# Patient Record
Sex: Female | Born: 1940 | Race: White | Hispanic: No | Marital: Married | Smoking: Never smoker
Health system: Southern US, Community
[De-identification: ages and names within clinical notes are randomized; demographics above are authoritative.]

## PROBLEM LIST (undated history)

## (undated) DIAGNOSIS — K3532 Acute appendicitis with perforation and localized peritonitis, without abscess: Secondary | ICD-10-CM

## (undated) DIAGNOSIS — Q430 Meckel's diverticulum (displaced) (hypertrophic): Secondary | ICD-10-CM

## (undated) DIAGNOSIS — I10 Essential (primary) hypertension: Secondary | ICD-10-CM

## (undated) DIAGNOSIS — H269 Unspecified cataract: Secondary | ICD-10-CM

## (undated) DIAGNOSIS — K56609 Unspecified intestinal obstruction, unspecified as to partial versus complete obstruction: Secondary | ICD-10-CM

## (undated) HISTORY — PX: OOPHORECTOMY: SHX86

## (undated) HISTORY — PX: CHOLECYSTECTOMY: SHX55

## (undated) HISTORY — PX: ABDOMINAL HYSTERECTOMY: SHX81

## (undated) HISTORY — PX: BREAST CYST ASPIRATION: SHX578

---

## 2010-05-06 ENCOUNTER — Ambulatory Visit: Payer: Self-pay | Admitting: Internal Medicine

## 2011-05-11 ENCOUNTER — Ambulatory Visit: Payer: Self-pay | Admitting: Internal Medicine

## 2012-05-11 ENCOUNTER — Ambulatory Visit: Payer: Self-pay | Admitting: Internal Medicine

## 2012-06-08 ENCOUNTER — Ambulatory Visit: Payer: Self-pay | Admitting: General Practice

## 2012-10-10 ENCOUNTER — Ambulatory Visit: Payer: Self-pay | Admitting: Internal Medicine

## 2012-10-27 ENCOUNTER — Ambulatory Visit: Payer: Self-pay | Admitting: Surgery

## 2012-10-27 LAB — POTASSIUM: Potassium: 3.8 mmol/L (ref 3.5–5.1)

## 2012-11-04 ENCOUNTER — Inpatient Hospital Stay: Payer: Self-pay | Admitting: General Surgery

## 2012-11-06 LAB — PLATELET COUNT: Platelet: 226 10*3/uL (ref 150–440)

## 2012-11-07 LAB — PATHOLOGY REPORT

## 2013-05-12 ENCOUNTER — Ambulatory Visit: Payer: Self-pay | Admitting: Internal Medicine

## 2013-12-03 ENCOUNTER — Emergency Department: Payer: Self-pay | Admitting: Emergency Medicine

## 2013-12-04 ENCOUNTER — Ambulatory Visit: Payer: Self-pay | Admitting: Orthopedic Surgery

## 2013-12-04 LAB — POTASSIUM: Potassium: 3.3 mmol/L — ABNORMAL LOW (ref 3.5–5.1)

## 2013-12-05 ENCOUNTER — Ambulatory Visit: Payer: Self-pay | Admitting: Orthopedic Surgery

## 2014-03-08 ENCOUNTER — Ambulatory Visit: Payer: Self-pay | Admitting: Unknown Physician Specialty

## 2014-03-09 LAB — PATHOLOGY REPORT

## 2014-05-15 ENCOUNTER — Ambulatory Visit: Payer: Self-pay | Admitting: Internal Medicine

## 2015-03-05 NOTE — Op Note (Signed)
PATIENT NAME:  Jill Salas, Jill Salas MR#:  [id number] DATE OF BIRTH:  16-Apr-1941  DATE OF PROCEDURE:  06/08/2012  PREOPERATIVE DIAGNOSIS: Displaced left distal radius fracture with ulnar styloid fracture.   POSTOPERATIVE DIAGNOSIS: Displaced left distal radius fracture with ulnar styloid fracture.   PROCEDURE PERFORMED: Open reduction and internal fixation of left distal radius fracture.   SURGEON: Laurice Record. Hooten, MD   ANESTHESIA: General.   ESTIMATED BLOOD LOSS: Minimal.   TOURNIQUET TIME: 69 minutes.   DRAINS: None.   IMPLANTS UTILIZED: [employer] DVRAS-L volar plate, seven 2.5 mm fully threaded pegs, and three 3.5 mm cortical screws.   INDICATIONS FOR SURGERY: The patient is a 74 year old right-hand dominant female who slipped and fell at home landing on her outstretched left hand. X-rays demonstrated displaced distal radius fracture. An incidental finding was minimally displaced fracture of the ulnar styloid. After discussion of the risks and benefits of surgical intervention, the patient expressed her understanding of the risks and benefits and agreed with plans for surgical intervention.   PROCEDURE IN DETAIL: The patient was brought to the Operating Room and, after adequate general anesthesia was achieved, a tourniquet was placed on the patient's upper left arm. The patient's left hand and arm were cleaned and prepped with alcohol and DuraPrep and draped in the usual sterile fashion. A "time out" was performed as per usual protocol. The left upper extremity was exsanguinated using an Esmarch, and the tourniquet was inflated to 250 mmHg. Loupe magnification was used throughout the procedure. A longitudinal incision was made along the volar surface of the forearm and wrist in line with the flexor carpi radialis tendon. Dissection was carried down through the tendon sheath and the tendon was retracted in an ulnar fashion. Dissection was carried down carefully to the pronator quadratus. A  figure-seven incision was made in the pronator quadratus and was then elevated off of the volar surface of the distal radius. The fracture site was identified and soft tissues removed from the fracture site. The fracture was then provisionally reduced and position confirmed in multiple views using the FluoroScan. A DVRAS-L volar plate was positioned on the volar surface of the wrist and provisionally maintained in position using two K wires. The reduction was felt to be excellent in both AP and lateral planes using FluoroScan. A 3.5 mm cortical screw was inserted in the slotted space of the proximal portion of the plate. Next, four 2.5 mm fully threaded pegs were inserted into the proximal row of the plate. These were locking pegs. Excellent position was noted and good maintenance of the reduction was appreciated. Next, three 2.5 mm fully threaded pegs were inserted into the distal row. Again, excellent position was noted. Finally, the proximal portion of the plate was secured using an additional two 3.5 mm cortical screws. Excellent reduction was appreciated with good position of hardware appreciated. The wound was irrigated with copious amounts of normal saline with antibiotic solution and then suctioned dry. The pronator quadratus was brought over the plate and tacked down using a #2-0 Vicryl. The tourniquet was deflated after total tourniquet time of 69 minutes. Good hemostasis was appreciated. The wound was then closed in layers using first #2-0 Vicryl followed by a running subcuticular suture of #4-0 Vicryl. Steri-Strips were applied. 10 mL of 0.25% Marcaine was injected along the incision site. A sterile dressing was applied followed by application of a volar splint.   The patient tolerated the procedure well. She was transported to the recovery room in stable  condition.  ____________________________ Laurice Record. Holley Bouche., MD [email or web address] D: 06/08/2012 15:50:48 ET T: 06/08/2012 16:25:31  ET JOB#: [id number]  cc: Laurice Record. Holley Bouche., MD, <Dictator> Laurice Record Holley Bouche MD ELECTRONICALLY SIGNED 06/08/2012 21:47

## 2015-03-08 NOTE — Op Note (Signed)
PATIENT NAME:  Jill Salas, Jill Salas MR#:  [id number] DATE OF BIRTH:  1941/09/12  DATE OF PROCEDURE:  11/04/2012  PREOPERATIVE DIAGNOSIS: Chronic acalculous cholecystitis.  POSTOPERATIVE DIAGNOSIS: Chronic acalculous cholecystitis, perforation of small bowel.   PROCEDURE: Laparoscopic cholecystectomy, cholangiogram and repair of small bowel.  SURGEON: Rochel Brome, MD     ANESTHESIA: General.   INDICATIONS: This patient has been evaluated for nausea and has had abnormally low gallbladder ejection fraction of 24%, has had gastroenterology  evaluation. She has some history of reflux, history of appendectomy, Meckel's diverticulectomy, and hysterectomy and history of bowel obstruction.  Laparoscopic cholecystectomy is recommended for definitive treatment.   DESCRIPTION OF PROCEDURE: The patient  was placed on the operating table in the supine position under general endotracheal anesthesia. The abdomen was prepared with ChloraPrep and draped in a sterile manner.   A short incision was made in the inferior aspect of the umbilicus and carried down to the deep fascia, which was grasped with a laryngeal hook. Next, with her history of multiple operations and bowel obstruction, elected to use the Xcel 11 mm trocar, and the laparoscope was inserted into the trocar and focused on the tissues, and I dissected with the trocar down through the abdominal wall and into the peritoneal cavity. Next, the peritoneal cavity was insufflated with carbon dioxide.  I could see there were a number of adhesions in the immediate area, and the scope was turned to the patient's left and then back to the right, and the liver came into view. The patient was placed in the reverse Trendelenburg position.  The next incision was made in the epigastrium slightly to the right of midline to introduce a 10 mm port. Two incisions were made in the lateral aspect of the right upper quadrant to introduce two 5 mm ports. The liver appeared to have  some old scarring on its surface but no visible mass. The gallbladder was retracted towards the right shoulder. The neck of the gallbladder was retracted inferiorly and laterally.  The porta hepatis was demonstrated. Next, dissection was carried out to isolate the cystic duct from surrounding tissues and also isolated the cystic artery from surrounding tissues. The neck of the gallbladder was mobilized with incision of the visceral peritoneum. A critical view of safety was demonstrated. Next, and Endo Clip was placed across the cystic duct adjacent to the neck of the gallbladder,  also two clips were placed on the cystic artery. Next the cystic artery was divided. This allowed better traction on the cystic duct. An opening was made in the cystic duct to introduce a Reddick Catheter. Half-strength Conray-60 dye was injected as the cholangiogram was done with fluoroscopy. The biliary tree appeared to be slightly dilated. There was a lucency seen in the distal bile duct which was very small and persisted with additional films. It appeared to be somewhat vague, possibly air bubble, although there was prompt flow of dye into the duodenum. The cholangiogram otherwise appeared normal.  The Reddick catheter  was removed. The cystic duct was doubly ligated with Endo Clips and divided. The gallbladder was dissected free from the liver with hook and cautery. There was no bleeding during this course of the dissection, and the gallbladder was delivered up through the infraumbilical port and palpated. I did not palpate any stones, and it was submitted in formalin for routine pathology. It is noted that the laparoscope was used to examine the periumbilical area and identified a number of loops of small bowel in  this area, and there was a suspicion of perforation of the small bowel although I do not see any contamination. I did lengthen the infraumbilical incision in the inferior direction and lengthened the fascial incision, and  it appeared that there was some small bowel mucosa visible. I subsequently enlarged the incision to ultimately approximately 2 inches, and this loop of small bowel with dissected free from surrounding structures. There were multiple adhesions between the small bowel and the anterior abdominal, and I did find a perforation of the antimesenteric border of the small bowel; and as the bowel was freed up, I determined that there was a second perforation near the mesenteric border. The mesenteric border perforation was brought up through the first defect and was small and was repaired with interrupted 5-0 Dexon sutures. Next, the defect on the antimesenteric border was closed transversely with a TA-30 stapler. The lumen remained patent according to palpation. One imbricating 5-0 Dexon suture was placed at the central port of the staple line. Hemostasis was intact. There was no gross contamination. Gloves were changed, and subsequently the laparoscopic instruments were removed. The fascia was closed with interrupted 0 Maxon figure-of-eight sutures. All skin incisions were closed with 5-0 chromic subcuticular suture, benzoin and Steri-Strips. Dressings were applied with paper tape.  The patient tolerated surgery satisfactorily and was prepared for transfer to the recovery room.   ____________________________ Lenna Sciara. Rochel Brome, MD [email or web address] D: 11/04/2012 13:13:00 ET T: 11/05/2012 16:09:27 ET JOB#: [id number]  cc: Loreli Dollar, MD, <Dictator> Loreli Dollar MD ELECTRONICALLY SIGNED 12/10/2012 12:39

## 2015-03-08 NOTE — Op Note (Signed)
PATIENT NAME:  Jill Salas, Jill Salas MR#:  [id number] DATE OF BIRTH:  1940-12-17  DATE OF PROCEDURE:  11/04/2012  PREOPERATIVE DIAGNOSIS: Chronic acalculous cholecystitis.  POSTOPERATIVE DIAGNOSIS: Chronic acalculous cholecystitis, perforation of small bowel.   PROCEDURE: Laparoscopic cholecystectomy, cholangiogram and repair of small bowel.  SURGEON: Rochel Brome, MD     ANESTHESIA: General.   INDICATIONS: This patient has been evaluated for nausea and has had abnormally low gallbladder ejection fraction of 24%, has had gastroenterology  evaluation. She has some history of reflux, history of appendectomy, Meckels diverticulectomy, and hysterectomy and history of bowel obstruction.  Laparoscopic cholecystectomy is recommended for definitive treatment.   DESCRIPTION OF PROCEDURE: The patient  was placed on the operating table in the supine position under general endotracheal anesthesia. The abdomen was prepared with ChloraPrep and draped in a sterile manner.   A short incision was made in the inferior aspect of the umbilicus and carried down to the deep fascia, which was grasped with a laryngeal hook. Next, with her history of multiple operations and bowel obstruction, elected to use the Xcel 11 mm trocar, and the laparoscope was inserted into the trocar and focused on the tissues, and I dissected with the trocar down through the abdominal wall and into the peritoneal cavity. Next, the peritoneal cavity was insufflated with carbon dioxide.  I could see there were a number of adhesions in the immediate area, and the scope was turned to the patient's left and then back to the right, and the liver came into view. The patient was placed in the reverse Trendelenburg position.  The next incision was made in the epigastrium slightly to the right of midline to introduce a 10 mm port. Two incisions were made in the lateral aspect of the right upper quadrant to introduce two 5 mm ports. The liver appeared to have  some old scarring on its surface but no visible mass. The gallbladder was retracted towards the right shoulder. The neck of the gallbladder was retracted inferiorly and laterally.  The porta hepatis was demonstrated. Next, dissection was carried out to isolate the cystic duct from surrounding tissues and also isolated the cystic artery from surrounding tissues. The neck of the gallbladder was mobilized with incision of the visceral peritoneum. A critical view of safety was demonstrated. Next, and Endo Clip was placed across the cystic duct adjacent to the neck of the gallbladder,  also two clips were placed on the cystic artery. Next the cystic artery was divided. This allowed better traction on the cystic duct. An opening was made in the cystic duct to introduce a Reddick Catheter. Half-strength Conray-60 dye was injected as the cholangiogram was done with fluoroscopy. The biliary tree appeared to be slightly dilated. There was a lucency seen in the distal bile duct which was very small and persisted with additional films. It appeared to be somewhat vague, possibly air bubble, although there was prompt flow of dye into the duodenum. The cholangiogram otherwise appeared normal.  The Reddick catheter  was removed. The cystic duct was doubly ligated with Endo Clips and divided. The gallbladder was dissected free from the liver with hook and cautery. There was no bleeding during this course of the dissection, and the gallbladder was delivered up through the infraumbilical port and palpated. I did not palpate any stones, and it was submitted in formalin for routine pathology. It is noted that the laparoscope was used to examine the periumbilical area and identified a number of loops of small bowel in  this area, and there was a suspicion of perforation of the small bowel although I do not see any contamination. I did lengthen the infraumbilical incision in the inferior direction and lengthened the fascial incision, and  it appeared that there was some small bowel mucosa visible. I subsequently enlarged the incision to ultimately approximately 2 inches, and this loop of small bowel with dissected free from surrounding structures. There were multiple adhesions between the small bowel and the anterior abdominal, and I did find a perforation of the antimesenteric border of the small bowel; and as the bowel was freed up, I determined that there was a second perforation near the mesenteric border. The mesenteric border perforation was brought up through the first defect and was small and was repaired with interrupted 5-0 Dexon sutures. Next, the defect on the antimesenteric border was closed transversely with a TA-30 stapler. The lumen remained patent according to palpation. One imbricating 5-0 Dexon suture was placed at the central port of the staple line. Hemostasis was intact. There was no gross contamination. Gloves were changed, and subsequently the laparoscopic instruments were removed. The fascia was closed with interrupted 0 Maxon figure-of-eight sutures. All skin incisions were closed with 5-0 chromic subcuticular suture, benzoin and Steri-Strips. Dressings were applied with paper tape.  The patient tolerated surgery satisfactorily and was prepared for transfer to the recovery room.   ____________________________ Kassie Mends, MD [email or web address] D: 11/04/2012 13:13:45 ET T: 11/05/2012 16:09:27 ET JOB#: [id number]  cc: Kassie Mends, MD, <Dictator>

## 2015-03-09 NOTE — Op Note (Signed)
PATIENT NAME:  Jill Salas, Jill Salas MR#:  [id number] DATE OF BIRTH:  [birth year]  DATE OF PROCEDURE:  12/05/2013  PREOPERATIVE DIAGNOSIS: Comminuted intra-articular right distal radius fracture.   POSTOPERATIVE DIAGNOSIS:  Comminuted intra-articular right distal radius fracture.   PROCEDURE: Open reduction and internal fixation right distal radius.   ANESTHESIA: General.   SURGEON: Hessie Knows, M.D.   DESCRIPTION OF PROCEDURE: The patient was brought to the operating room and after  general anesthesia was obtained, the right arm was prepped and draped in the usual sterile fashion with a tourniquet applied to the upper arm.  After patient identification and timeout procedures were completed, the arm tourniquet was raised to 250 mmHg. Fingertraps were applied to the index and middle fingers with 10 pounds of traction to aid in reduction. With traction and flexion, near anatomic alignment could be obtained before opening the skin.   With a volar approach made centered over the FCR tendon, the FCR tendon was identified, tendon sheath incised and the tendon retracted radially. The deep fascia was then incised and the pronator was identified and lifted off distally off its distal and radial sides, exposing the fracture site. With the traction in place, a short narrow DVR plate was applied and this was pinned in place and a single screw inserted into the slotted hole for final adjustment of plate position. When plate position was acceptable, 2 additional cortical screw holes were filled using standard technique, drilling, measuring and placing 10 mm screws. Going distally with the wrist in flexion, a threaded peg was initially placed within a subsequent multiaccess screw to try to get the large posterior fragment. After these initial screws were placed, the fracture did appear to be in position and the remaining holes were filled using smooth pegs, drilling, measuring and placing the smooth pegs. Traction was  released and on range of motion the fracture appeared stable.   The wound was irrigated and closed with 3-0 Vicryl subcutaneously and 4-0 nylon skin. Tourniquet time was 23 minutes at 250 mmHg, tourniquet being let down prior to completion of the case and hemostasis checked with electrocautery. A volar splint was applied with Xeroform, 4 x 4's, a volar splint and Ace wrap. The patient was sent to the recovery room in stable condition. There were no complications.   SPECIMEN: No specimen.   IMPLANTS: [employer] DVR volar plate and screws.    ____________________________ Laurene Footman, MD [email or web address] D: 12/05/2013 19:52:13 ET T: 12/05/2013 20:07:00 ET JOB#: [id number]  cc: Laurene Footman, MD, <Dictator> Laurene Footman MD ELECTRONICALLY SIGNED 12/06/2013 7:35

## 2015-05-15 ENCOUNTER — Other Ambulatory Visit: Payer: Self-pay | Admitting: Internal Medicine

## 2015-05-15 DIAGNOSIS — Z1231 Encounter for screening mammogram for malignant neoplasm of breast: Secondary | ICD-10-CM

## 2015-05-17 ENCOUNTER — Ambulatory Visit
Admission: RE | Admit: 2015-05-17 | Discharge: 2015-05-17 | Disposition: A | Discharge status: Home / Self Care | Payer: Medicare Other | Source: Ambulatory Visit | Attending: Internal Medicine | Admitting: Internal Medicine

## 2015-05-17 DIAGNOSIS — Z1231 Encounter for screening mammogram for malignant neoplasm of breast: Secondary | ICD-10-CM | POA: Insufficient documentation

## 2016-03-10 ENCOUNTER — Other Ambulatory Visit: Payer: Self-pay | Admitting: Internal Medicine

## 2016-03-10 DIAGNOSIS — Z1231 Encounter for screening mammogram for malignant neoplasm of breast: Secondary | ICD-10-CM

## 2016-05-20 ENCOUNTER — Other Ambulatory Visit: Payer: Self-pay | Admitting: Internal Medicine

## 2016-05-20 ENCOUNTER — Ambulatory Visit
Admission: RE | Admit: 2016-05-20 | Discharge: 2016-05-20 | Disposition: A | Discharge status: Home / Self Care | Payer: Medicare Other | Source: Ambulatory Visit | Attending: Internal Medicine | Admitting: Internal Medicine

## 2016-05-20 DIAGNOSIS — Z1231 Encounter for screening mammogram for malignant neoplasm of breast: Secondary | ICD-10-CM

## 2016-05-26 ENCOUNTER — Encounter: Payer: Self-pay | Admitting: Emergency Medicine

## 2016-05-26 ENCOUNTER — Emergency Department: Payer: Medicare Other

## 2016-05-26 ENCOUNTER — Emergency Department
Admission: EM | Admit: 2016-05-26 | Discharge: 2016-05-26 | Disposition: A | Discharge status: Home / Self Care | Payer: Medicare Other | Attending: Emergency Medicine | Admitting: Emergency Medicine

## 2016-05-26 DIAGNOSIS — I10 Essential (primary) hypertension: Secondary | ICD-10-CM | POA: Diagnosis not present

## 2016-05-26 DIAGNOSIS — Y9389 Activity, other specified: Secondary | ICD-10-CM | POA: Diagnosis not present

## 2016-05-26 DIAGNOSIS — Z7982 Long term (current) use of aspirin: Secondary | ICD-10-CM | POA: Insufficient documentation

## 2016-05-26 DIAGNOSIS — S2611XA Contusion of heart without hemopericardium, initial encounter: Secondary | ICD-10-CM

## 2016-05-26 DIAGNOSIS — Y92410 Unspecified street and highway as the place of occurrence of the external cause: Secondary | ICD-10-CM | POA: Diagnosis not present

## 2016-05-26 DIAGNOSIS — Y999 Unspecified external cause status: Secondary | ICD-10-CM | POA: Insufficient documentation

## 2016-05-26 DIAGNOSIS — R0781 Pleurodynia: Secondary | ICD-10-CM | POA: Diagnosis present

## 2016-05-26 DIAGNOSIS — Z79899 Other long term (current) drug therapy: Secondary | ICD-10-CM | POA: Diagnosis not present

## 2016-05-26 DIAGNOSIS — S2220XA Unspecified fracture of sternum, initial encounter for closed fracture: Secondary | ICD-10-CM | POA: Diagnosis not present

## 2016-05-26 HISTORY — DX: Essential (primary) hypertension: I10

## 2016-05-26 LAB — CBC
HEMATOCRIT: 40.4 % (ref 35.0–47.0)
HEMOGLOBIN: 13.7 g/dL (ref 12.0–16.0)
MCH: 30.3 pg (ref 26.0–34.0)
MCHC: 34 g/dL (ref 32.0–36.0)
MCV: 88.9 fL (ref 80.0–100.0)
Platelets: 174 10*3/uL (ref 150–440)
RBC: 4.54 MIL/uL (ref 3.80–5.20)
RDW: 13.7 % (ref 11.5–14.5)
WBC: 14.7 10*3/uL — ABNORMAL HIGH (ref 3.6–11.0)

## 2016-05-26 LAB — COMPREHENSIVE METABOLIC PANEL
ALBUMIN: 4.5 g/dL (ref 3.5–5.0)
ALK PHOS: 74 U/L (ref 38–126)
ALT: 25 U/L (ref 14–54)
ANION GAP: 9 (ref 5–15)
AST: 28 U/L (ref 15–41)
BILIRUBIN TOTAL: 0.6 mg/dL (ref 0.3–1.2)
BUN: 24 mg/dL — ABNORMAL HIGH (ref 6–20)
CALCIUM: 9.5 mg/dL (ref 8.9–10.3)
CO2: 22 mmol/L (ref 22–32)
Chloride: 106 mmol/L (ref 101–111)
Creatinine, Ser: 0.64 mg/dL (ref 0.44–1.00)
GLUCOSE: 115 mg/dL — AB (ref 65–99)
Potassium: 3.5 mmol/L (ref 3.5–5.1)
Sodium: 137 mmol/L (ref 135–145)
TOTAL PROTEIN: 7.2 g/dL (ref 6.5–8.1)

## 2016-05-26 LAB — TROPONIN I
Troponin I: <0.03 ng/mL (ref <0.03)
Troponin I: 0.03 ng/mL (ref <0.03)

## 2016-05-26 MED ORDER — IOPAMIDOL (ISOVUE-300) INJECTION 61%
75.0000 mL | Freq: Once | INTRAVENOUS | Status: AC | PRN
  Administered 2016-05-26: 75 mL via INTRAVENOUS

## 2016-05-26 MED ORDER — TRAMADOL HCL 50 MG PO TABS
50.0000 mg | ORAL_TABLET | ORAL | Status: AC
  Administered 2016-05-26: 50 mg via ORAL
  Filled 2016-05-26: qty 1

## 2016-05-26 MED ORDER — TRAMADOL HCL 50 MG PO TABS: 50.0000 mg | ORAL_TABLET | Freq: Four times a day (QID) | ORAL | Status: AC | PRN

## 2016-05-26 MED ORDER — ACETAMINOPHEN 325 MG PO TABS
650.0000 mg | ORAL_TABLET | ORAL | Status: AC
  Administered 2016-05-26: 650 mg via ORAL
  Filled 2016-05-26: qty 2
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
## 2016-09-07 ENCOUNTER — Emergency Department
Admission: EM | Admit: 2016-09-07 | Discharge: 2016-09-07 | Disposition: A | Discharge status: Home / Self Care | Payer: Medicare Other | Attending: Emergency Medicine | Admitting: Emergency Medicine

## 2016-09-07 ENCOUNTER — Emergency Department: Payer: Medicare Other

## 2016-09-07 ENCOUNTER — Encounter: Payer: Self-pay | Admitting: Emergency Medicine

## 2016-09-07 DIAGNOSIS — I10 Essential (primary) hypertension: Secondary | ICD-10-CM | POA: Diagnosis not present

## 2016-09-07 DIAGNOSIS — Z79899 Other long term (current) drug therapy: Secondary | ICD-10-CM | POA: Insufficient documentation

## 2016-09-07 DIAGNOSIS — R110 Nausea: Secondary | ICD-10-CM | POA: Insufficient documentation

## 2016-09-07 DIAGNOSIS — R42 Dizziness and giddiness: Secondary | ICD-10-CM

## 2016-09-07 HISTORY — DX: Meckel's diverticulum (displaced) (hypertrophic): Q43.0

## 2016-09-07 HISTORY — DX: Unspecified cataract: H26.9

## 2016-09-07 HISTORY — DX: Acute appendicitis with perforation, localized peritonitis, and gangrene, without abscess: K35.32

## 2016-09-07 HISTORY — DX: Unspecified intestinal obstruction, unspecified as to partial versus complete obstruction: K56.609

## 2016-09-07 LAB — COMPREHENSIVE METABOLIC PANEL
ALT: 21 U/L (ref 14–54)
AST: 21 U/L (ref 15–41)
Albumin: 3.8 g/dL (ref 3.5–5.0)
Alkaline Phosphatase: 87 U/L (ref 38–126)
Anion gap: 6 (ref 5–15)
BUN: 22 mg/dL — ABNORMAL HIGH (ref 6–20)
CHLORIDE: 107 mmol/L (ref 101–111)
CO2: 26 mmol/L (ref 22–32)
Calcium: 8.9 mg/dL (ref 8.9–10.3)
Creatinine, Ser: 0.61 mg/dL (ref 0.44–1.00)
Glucose, Bld: 120 mg/dL — ABNORMAL HIGH (ref 65–99)
POTASSIUM: 3.8 mmol/L (ref 3.5–5.1)
SODIUM: 139 mmol/L (ref 135–145)
Total Bilirubin: 0.7 mg/dL (ref 0.3–1.2)
Total Protein: 6.6 g/dL (ref 6.5–8.1)

## 2016-09-07 LAB — CBC WITH DIFFERENTIAL/PLATELET
BASOS ABS: 0 10*3/uL (ref 0–0.1)
Basophils Relative: 0 %
EOS ABS: 0 10*3/uL (ref 0–0.7)
EOS PCT: 0 %
HCT: 39.1 % (ref 35.0–47.0)
Hemoglobin: 13.3 g/dL (ref 12.0–16.0)
LYMPHS PCT: 8 %
Lymphs Abs: 0.8 10*3/uL — ABNORMAL LOW (ref 1.0–3.6)
MCH: 30.2 pg (ref 26.0–34.0)
MCHC: 33.9 g/dL (ref 32.0–36.0)
MCV: 89.1 fL (ref 80.0–100.0)
MONO ABS: 0.3 10*3/uL (ref 0.2–0.9)
Monocytes Relative: 3 %
Neutro Abs: 9.8 10*3/uL — ABNORMAL HIGH (ref 1.4–6.5)
Neutrophils Relative %: 89 %
PLATELETS: 211 10*3/uL (ref 150–440)
RBC: 4.39 MIL/uL (ref 3.80–5.20)
RDW: 13.2 % (ref 11.5–14.5)
WBC: 11.1 10*3/uL — AB (ref 3.6–11.0)

## 2016-09-07 LAB — LIPASE, BLOOD: LIPASE: 26 U/L (ref 11–51)

## 2016-09-07 LAB — TROPONIN I

## 2016-09-07 MED ORDER — MECLIZINE HCL 25 MG PO TABS
12.5000 mg | ORAL_TABLET | Freq: Once | ORAL | Status: AC
  Administered 2016-09-07: 12.5 mg via ORAL
  Filled 2016-09-07: qty 1

## 2016-09-07 MED ORDER — SODIUM CHLORIDE 0.9 % IV BOLUS (SEPSIS)
1000.0000 mL | Freq: Once | INTRAVENOUS | Status: AC
  Administered 2016-09-07: 1000 mL via INTRAVENOUS

## 2016-09-07 MED ORDER — DIPHENHYDRAMINE HCL 50 MG/ML IJ SOLN
12.5000 mg | Freq: Once | INTRAMUSCULAR | Status: AC
  Administered 2016-09-07: 12.5 mg via INTRAVENOUS
  Filled 2016-09-07: qty 1

## 2016-09-07 MED ORDER — MECLIZINE HCL 12.5 MG PO TABS: 12.5000 mg | ORAL_TABLET | Freq: Two times a day (BID) | ORAL | 0 refills | Status: AC | PRN

## 2016-09-07 MED ORDER — METOCLOPRAMIDE HCL 5 MG/ML IJ SOLN
10.0000 mg | Freq: Once | INTRAMUSCULAR | Status: AC
  Administered 2016-09-07: 10 mg via INTRAVENOUS
  Filled 2016-09-07: qty 2

## 2016-09-07 NOTE — Discharge Instructions (Signed)
Take meclizine as needed for dizziness.   See your doctor.   You are likely going to be dizzy for several days   Return to ER if you have worse dizziness, unsteady gait, falling over, weakness.

## 2016-09-07 NOTE — ED Provider Notes (Signed)
[facility] Provider Note   CSN: [id number] Arrival date & time: 09/07/16  [id number]     History   Chief Complaint Chief Complaint  Patient presents with  . Dizziness  . Nausea    HPI Jill Salas is a 75 y.o. female hx of ruptured appy, SBO, here with nausea, dizziness. She woke up around 1:30 AM and went to the restroom and felt extremely dizzy. She states that the room was spinning and she had trouble walking at that time. Over the last several days her ears feel full but denies any purulent discharge from the ear. She had some ringing in the ears. Denies vomiting today. Has some headaches as well. No hx of vertigo in the past. No hx of strokes but has family hx of stroke. Has some epigastric pain as well. Has hx of SBO but this is different.   The history is provided by the patient.    Past Medical History:  Diagnosis Date  . Bowel obstruction   . Cataracts, bilateral   . Hypertension   . Meckel's diverticulum   . Ruptured appendix     There are no active problems to display for this patient.   Past Surgical History:  Procedure Laterality Date  . BREAST CYST ASPIRATION Right 1960's  . CHOLECYSTECTOMY      OB History    No data available       Home Medications    Prior to Admission medications   Medication Sig Start Date End Date Taking? Authorizing Provider  aspirin EC 81 MG tablet Take 81 mg by mouth daily.    Historical Provider, MD  hydrochlorothiazide (HYDRODIURIL) 12.5 MG tablet Take 12.5 mg by mouth daily.    Historical Provider, MD  losartan (COZAAR) 100 MG tablet Take 100 mg by mouth daily.    Historical Provider, MD  Multiple Vitamin (MULTIVITAMIN WITH MINERALS) TABS tablet Take 1 tablet by mouth daily.    Historical Provider, MD  pantoprazole (PROTONIX) 20 MG tablet Take 20 mg by mouth daily as needed.    Historical Provider, MD  traMADol (ULTRAM) 50 MG tablet Take 1 tablet (50 mg total) by mouth every 6 (six) hours as needed. 05/26/16    Delman Kitten, MD    Family History Family History  Problem Relation Age of Onset  . Breast cancer Paternal Aunt     Social History Social History  Substance Use Topics  . Smoking status: Never Smoker  . Smokeless tobacco: Never Used  . Alcohol use No     Allergies   Morphine and related; Pravastatin; Sulfa antibiotics; and Azelastine   Review of Systems Review of Systems  Gastrointestinal: Positive for nausea.  Neurological: Positive for dizziness.  All other systems reviewed and are negative.    Physical Exam Updated Vital Signs BP (!) 154/81   Pulse 83   Temp 98.1 F (36.7 C) (Oral)   Resp 16   Ht 5\' 8"  (1.727 m)   Wt 178 lb (80.7 kg)   SpO2 96%   BMI 27.06 kg/m   Physical Exam  Constitutional: She is oriented to person, place, and time.  Uncomfortable   HENT:  Head: Normocephalic.  Mouth/Throat: Oropharynx is clear and moist.  Eyes: EOM are normal. Pupils are equal, round, and reactive to light.  No obvious nystagmus   Neck: Normal range of motion. Neck supple.  Cardiovascular: Normal rate, regular rhythm and normal heart sounds.   Pulmonary/Chest: Effort normal and breath sounds normal. No respiratory distress.  She has no wheezes. She has no rales.  Abdominal: Soft. Bowel sounds are normal. She exhibits no distension. There is no tenderness. There is no guarding.  Musculoskeletal: Normal range of motion.  R ankle splint in place (recent ankle fracture), R calf slightly swollen. No obvious calf tenderness   Neurological: She is alert and oriented to person, place, and time.  CN 2-12 intact. Nl strength throughout. Nl finger to nose   Skin: Skin is warm.  Psychiatric: She has a normal mood and affect.  Nursing note and vitals reviewed.    ED Treatments / Results  Labs (all labs ordered are listed, but only abnormal results are displayed) Labs Reviewed  CBC WITH DIFFERENTIAL/PLATELET - Abnormal; Notable for the following:       Result Value    WBC 11.1 (*)    Neutro Abs 9.8 (*)    Lymphs Abs 0.8 (*)    All other components within normal limits  COMPREHENSIVE METABOLIC PANEL - Abnormal; Notable for the following:    Glucose, Bld 120 (*)    BUN 22 (*)    All other components within normal limits  TROPONIN I  LIPASE, BLOOD    EKG  EKG Interpretation None      ED ECG REPORT I, Wandra Arthurs, the attending physician, personally viewed and interpreted this ECG.   Date: 09/07/2016  EKG Time: 10:07 am  Rate: 86  Rhythm: normal EKG, normal sinus rhythm  Axis: normal  Intervals:none  ST&T Change: nonspecific    Radiology Mr Brain Wo Contrast  Result Date: 09/07/2016 CLINICAL DATA:  Dizziness and vertigo EXAM: MRI HEAD WITHOUT CONTRAST TECHNIQUE: Multiplanar, multiecho pulse sequences of the brain and surrounding structures were obtained without intravenous contrast. COMPARISON:  None. FINDINGS: Brain: No acute infarction, hemorrhage, hydrocephalus, extra-axial collection or mass lesion. Mild deep cerebral white matter disease, presumably microvascular ischemic gliosis in this patient with hypertension. Normal brain volume. No chronic blood products. Vascular: Normal flow voids. Skull and upper cervical spine: Normal marrow signal. Notable C2-3 facet arthropathy. Sinuses/Orbits: Negative.  No evidence of mastoiditis. IMPRESSION: No acute finding, including infarct.  No explanation for dizziness. Electronically Signed   By: Monte Fantasia M.D.   On: 09/07/2016 09:35   Dg Abdomen Acute W/chest  Result Date: 09/07/2016 CLINICAL DATA:  75 year old female with history of upset stomach this morning, with nausea and vomiting. Prior history of bowel obstruction 15 years ago. EXAM: DG ABDOMEN ACUTE W/ 1V CHEST COMPARISON:  Chest x-ray 05/26/2016. FINDINGS: Lung volumes are normal. No consolidative airspace disease. No pleural effusions. No pneumothorax. No pulmonary nodule or mass noted. Pulmonary vasculature and the cardiomediastinal  silhouette are within normal limits. Atherosclerosis in the thoracic aorta. Gas and stool are seen scattered throughout the colon extending to the level of the distal rectum. No pathologic distension of small bowel is noted. No gross evidence of pneumoperitoneum. Surgical clips project over the right upper quadrant of the abdomen, compatible with prior cholecystectomy. IMPRESSION: 1.  Nonobstructive bowel gas pattern. 2. No pneumoperitoneum. 3. No radiographic evidence of acute cardiopulmonary disease. 4. Aortic atherosclerosis. Electronically Signed   By: Vinnie Langton M.D.   On: 09/07/2016 08:53    Procedures Procedures (including critical care time)  Medications Ordered in ED Medications  sodium chloride 0.9 % bolus 1,000 mL (1,000 mLs Intravenous New Bag/Given 09/07/16 0810)  metoCLOPramide (REGLAN) injection 10 mg (10 mg Intravenous Given 09/07/16 0805)  diphenhydrAMINE (BENADRYL) injection 12.5 mg (12.5 mg Intravenous Given 09/07/16 0804)  meclizine (ANTIVERT) tablet 12.5 mg (12.5 mg Oral Given 09/07/16 0805)     Initial Impression / Assessment and Plan / ED Course  I have reviewed the triage vital signs and the nursing notes.  Pertinent labs & imaging results that were available during my care of the patient were reviewed by me and considered in my medical decision making (see chart for details).  Clinical Course    SAHER HICKEL is a 75 y.o. female here with dizziness, nausea. Consider posterior stroke vs BPPV vs SBO, less likely ACS. Will get labs, MRI brain, acute abdominal series. Will give migraine cocktail and reassess.     11:20 AM Labs unremarkable. MRI brain unremarkable. Felt better with meclizine, reglan. Able to ambulate. Likely BPPV. Recommend reglan prn.   Final Clinical Impressions(s) / ED Diagnoses   Final diagnoses:  None    New Prescriptions New Prescriptions   No medications on file     Drenda Freeze, MD 09/07/16 1121

## 2016-09-07 NOTE — ED Notes (Signed)
Pt took BP medication Losartan 100mg  at this time.  This was the patient's personal daily medication from home that Dr. Darl Householder approved the patient.

## 2016-09-07 NOTE — ED Triage Notes (Signed)
Pt to ED via EMS after waking up this morning at 0130 with extreme dizziness and nausea & dry heaving.  Pt states her ears have been feeling ull the past couple of days.  Denies fever and diarrhea.  Pt alert and oriented and continues with nausea & dizziness.

## 2017-03-19 ENCOUNTER — Other Ambulatory Visit: Payer: Self-pay | Admitting: Internal Medicine

## 2017-03-19 DIAGNOSIS — Z1231 Encounter for screening mammogram for malignant neoplasm of breast: Secondary | ICD-10-CM

## 2017-05-24 ENCOUNTER — Ambulatory Visit
Admission: RE | Admit: 2017-05-24 | Discharge: 2017-05-24 | Disposition: A | Discharge status: Home / Self Care | Payer: Medicare Other | Source: Ambulatory Visit | Attending: Internal Medicine | Admitting: Internal Medicine

## 2017-05-24 DIAGNOSIS — Z1231 Encounter for screening mammogram for malignant neoplasm of breast: Secondary | ICD-10-CM | POA: Insufficient documentation

## 2018-04-14 ENCOUNTER — Other Ambulatory Visit: Payer: Self-pay | Admitting: Internal Medicine

## 2018-04-14 DIAGNOSIS — Z1231 Encounter for screening mammogram for malignant neoplasm of breast: Secondary | ICD-10-CM

## 2018-05-31 ENCOUNTER — Ambulatory Visit
Admission: RE | Admit: 2018-05-31 | Discharge: 2018-05-31 | Disposition: A | Discharge status: Home / Self Care | Payer: Medicare Other | Source: Ambulatory Visit | Attending: Internal Medicine | Admitting: Internal Medicine

## 2018-05-31 DIAGNOSIS — Z1231 Encounter for screening mammogram for malignant neoplasm of breast: Secondary | ICD-10-CM

## 2019-04-25 ENCOUNTER — Other Ambulatory Visit: Payer: Self-pay | Admitting: Internal Medicine

## 2019-04-25 DIAGNOSIS — Z1231 Encounter for screening mammogram for malignant neoplasm of breast: Secondary | ICD-10-CM

## 2019-06-08 ENCOUNTER — Ambulatory Visit
Admission: RE | Admit: 2019-06-08 | Discharge: 2019-06-08 | Disposition: A | Discharge status: Home / Self Care | Payer: Medicare Other | Source: Ambulatory Visit | Attending: Internal Medicine | Admitting: Internal Medicine

## 2019-06-08 DIAGNOSIS — Z1231 Encounter for screening mammogram for malignant neoplasm of breast: Secondary | ICD-10-CM | POA: Diagnosis present

## 2020-04-09 ENCOUNTER — Other Ambulatory Visit: Payer: Self-pay | Admitting: Internal Medicine

## 2020-04-09 DIAGNOSIS — Z1231 Encounter for screening mammogram for malignant neoplasm of breast: Secondary | ICD-10-CM

## 2020-06-10 ENCOUNTER — Ambulatory Visit
Admission: RE | Admit: 2020-06-10 | Discharge: 2020-06-10 | Disposition: A | Discharge status: Home / Self Care | Payer: Medicare Other | Source: Ambulatory Visit | Attending: Internal Medicine | Admitting: Internal Medicine

## 2020-06-10 DIAGNOSIS — Z1231 Encounter for screening mammogram for malignant neoplasm of breast: Secondary | ICD-10-CM

## 2021-05-06 ENCOUNTER — Other Ambulatory Visit: Payer: Self-pay | Admitting: Internal Medicine

## 2021-05-06 DIAGNOSIS — Z1231 Encounter for screening mammogram for malignant neoplasm of breast: Secondary | ICD-10-CM

## 2021-06-11 ENCOUNTER — Ambulatory Visit
Admission: RE | Admit: 2021-06-11 | Discharge: 2021-06-11 | Disposition: A | Discharge status: Home / Self Care | Payer: Medicare Other | Source: Ambulatory Visit | Attending: Internal Medicine | Admitting: Internal Medicine

## 2021-06-11 ENCOUNTER — Other Ambulatory Visit: Payer: Self-pay

## 2021-06-11 DIAGNOSIS — Z1231 Encounter for screening mammogram for malignant neoplasm of breast: Secondary | ICD-10-CM | POA: Insufficient documentation

## 2022-03-18 ENCOUNTER — Other Ambulatory Visit: Payer: Self-pay | Admitting: Orthopedic Surgery

## 2022-03-18 DIAGNOSIS — M545 Low back pain, unspecified: Secondary | ICD-10-CM

## 2022-03-18 DIAGNOSIS — M5136 Other intervertebral disc degeneration, lumbar region: Secondary | ICD-10-CM

## 2022-03-18 DIAGNOSIS — M4807 Spinal stenosis, lumbosacral region: Secondary | ICD-10-CM

## 2022-04-01 ENCOUNTER — Ambulatory Visit
Admission: RE | Admit: 2022-04-01 | Discharge: 2022-04-01 | Disposition: A | Discharge status: Home / Self Care | Payer: Medicare Other | Source: Ambulatory Visit | Attending: Orthopedic Surgery | Admitting: Orthopedic Surgery

## 2022-04-01 DIAGNOSIS — M4807 Spinal stenosis, lumbosacral region: Secondary | ICD-10-CM | POA: Diagnosis present

## 2022-04-01 DIAGNOSIS — M5136 Other intervertebral disc degeneration, lumbar region: Secondary | ICD-10-CM | POA: Insufficient documentation

## 2022-04-01 DIAGNOSIS — M51369 Other intervertebral disc degeneration, lumbar region without mention of lumbar back pain or lower extremity pain: Secondary | ICD-10-CM

## 2022-04-01 DIAGNOSIS — M545 Low back pain, unspecified: Secondary | ICD-10-CM

## 2022-05-04 ENCOUNTER — Other Ambulatory Visit: Payer: Self-pay | Admitting: Internal Medicine

## 2022-05-04 DIAGNOSIS — Z1231 Encounter for screening mammogram for malignant neoplasm of breast: Secondary | ICD-10-CM

## 2022-06-12 ENCOUNTER — Ambulatory Visit
Admission: RE | Admit: 2022-06-12 | Discharge: 2022-06-12 | Disposition: A | Discharge status: Home / Self Care | Payer: Medicare Other | Source: Ambulatory Visit | Attending: Internal Medicine | Admitting: Internal Medicine

## 2022-06-12 DIAGNOSIS — Z1231 Encounter for screening mammogram for malignant neoplasm of breast: Secondary | ICD-10-CM | POA: Diagnosis present

## 2022-09-28 IMAGING — MR MR LUMBAR SPINE W/O CM
5 series · 31 of 48 positions shown · non-contrast
Comparison: None Available.

CLINICAL DATA: Left leg pain from hip to ankle for 3 weeks

EXAM:
MRI LUMBAR SPINE WITHOUT CONTRAST
TECHNIQUE: Multiplanar, multisequence MR imaging of the lumbar spine was
performed. No intravenous contrast was administered.

[Series 5: T2 · sagittal · 4.0mm · 0.81mm/px · 6 of 17 slices shown (1 of 2)]
[im 1/17]
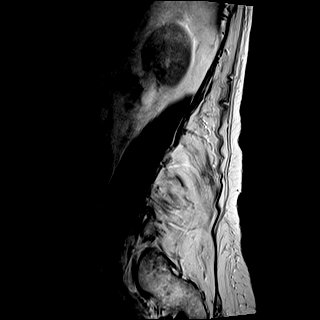
[im 4/17]
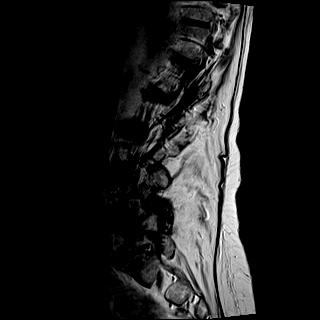
[im 7/17]
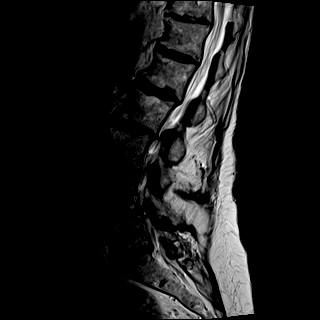
[im 10/17]
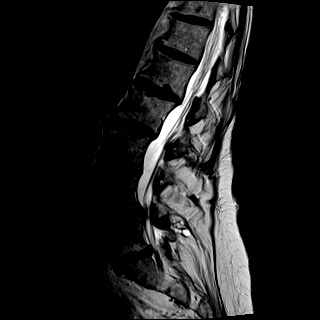
[im 13/17]
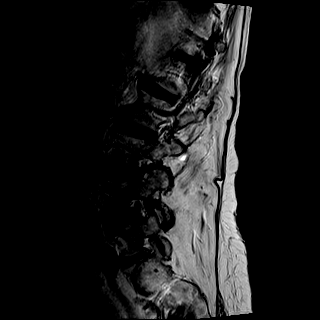
[im 17/17]
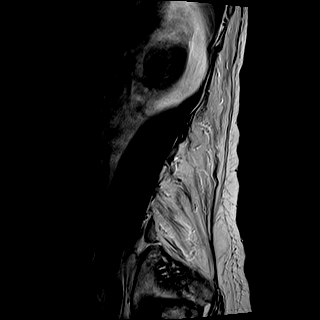

[Series 6: T1 · sagittal · 4.0mm · 0.81mm/px · 7 of 17 slices shown (1 of 2)]
[im 1/17]
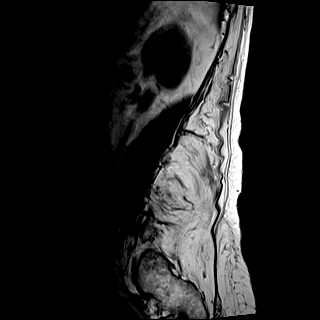
[im 3/17]
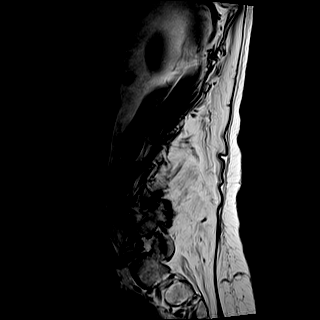
[im 6/17]
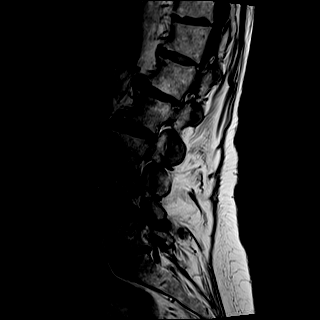
[im 9/17]
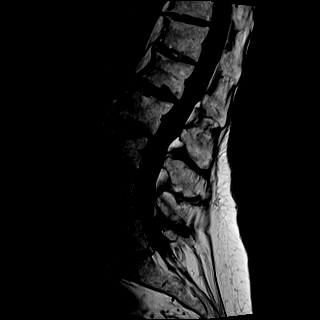
[im 11/17]
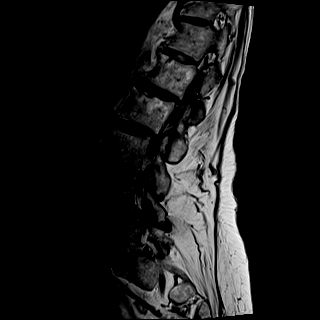
[im 14/17]
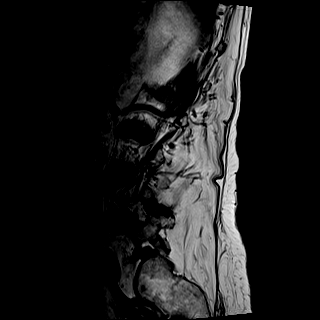
[im 17/17]
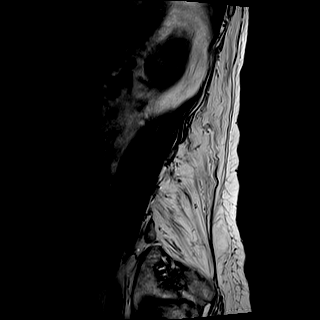

[Series 7: STIR · sagittal · 4.0mm · 0.41mm/px · 2 of 17 slices shown]
[im 1/17]
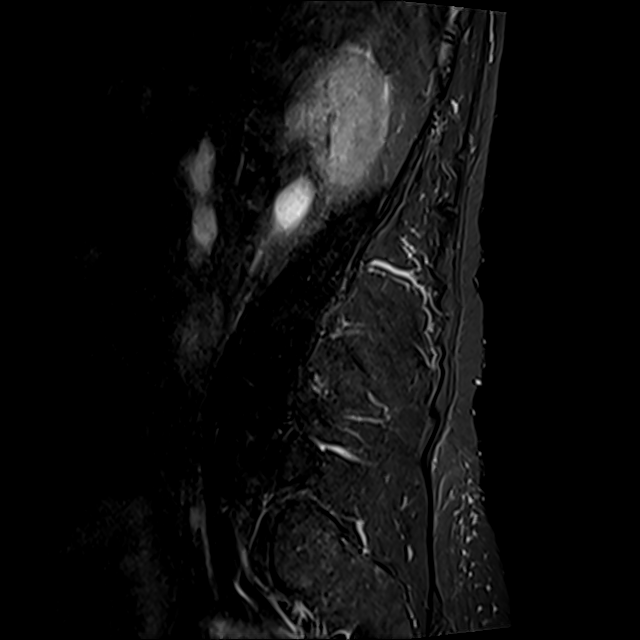
[im 3/17]
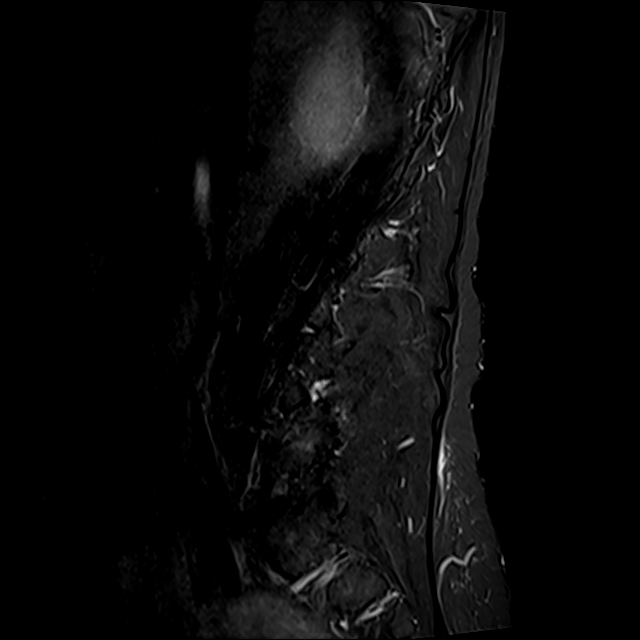

[Series 8: T2 · axial · 4.0mm · 0.78mm/px · z∈[-94,+121]mm · 8 of 34 slices shown (2 of 2)]
[im 1/34]
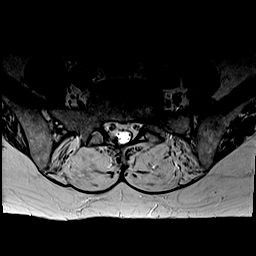
[im 6/34]
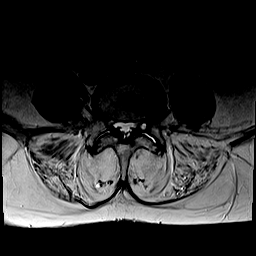
[im 11/34]
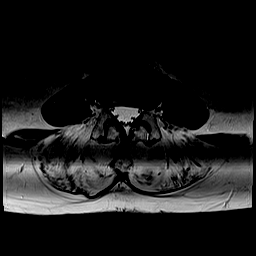
[im 16/34]
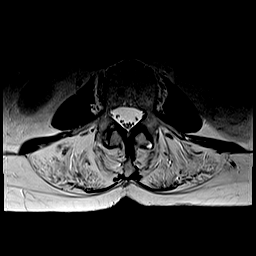
[im 18/34]
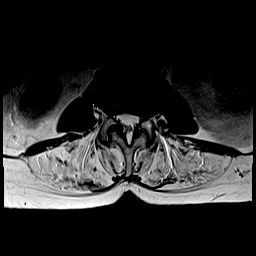
[im 23/34]
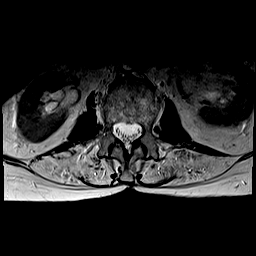
[im 28/34]
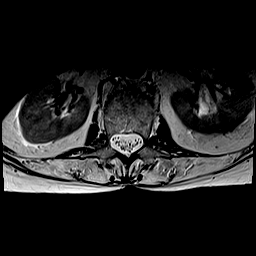
[im 34/34]
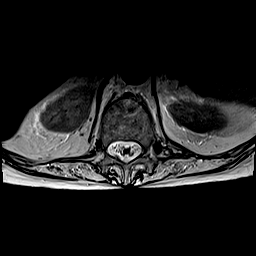

[Series 9: T1 · axial · 4.0mm · 0.39mm/px · z∈[-94,+121]mm · 8 of 34 slices shown (2 of 2)]
[im 1/34]
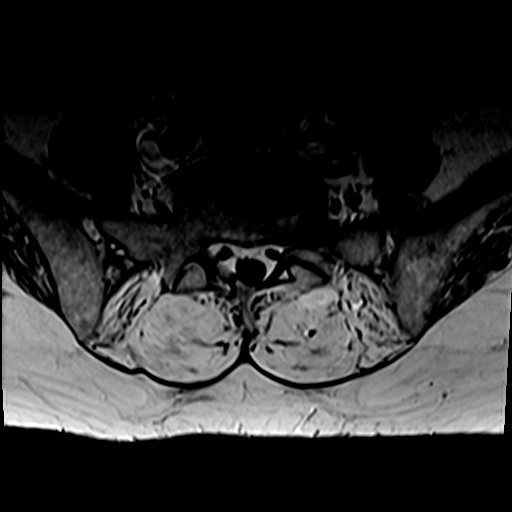
[im 6/34]
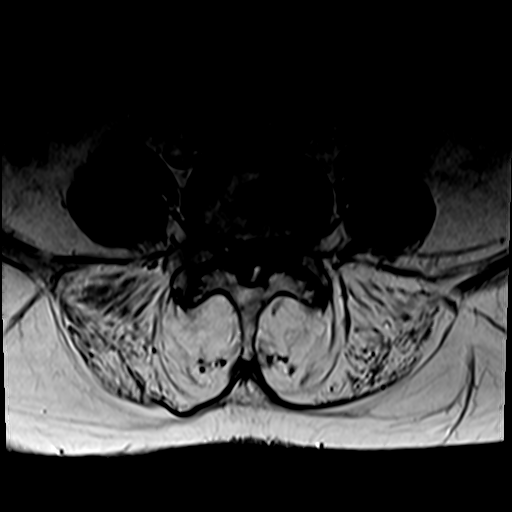
[im 11/34]
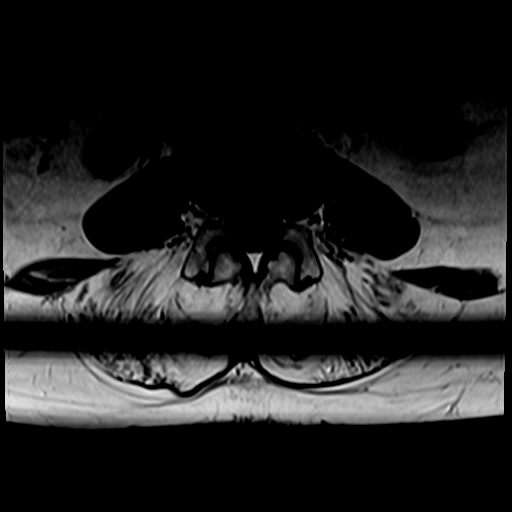
[im 16/34]
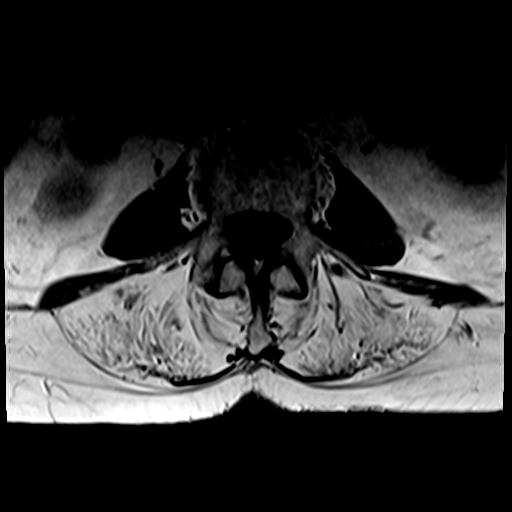
[im 18/34]
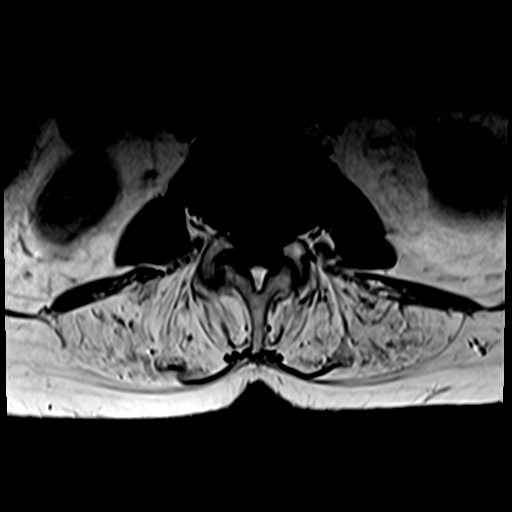
[im 23/34]
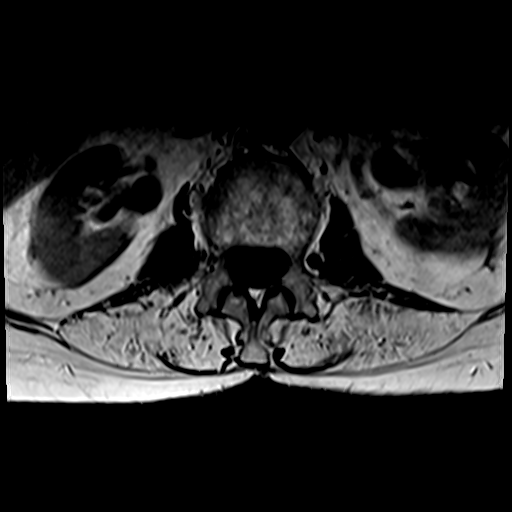
[im 28/34]
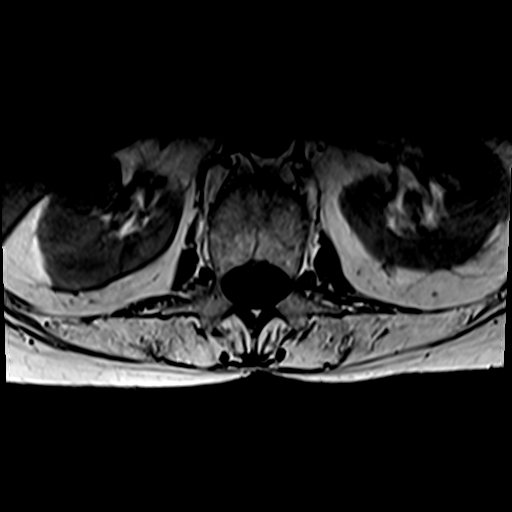
[im 34/34]
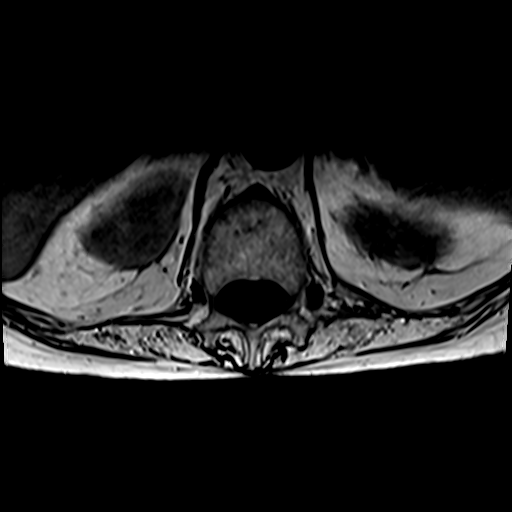

[31 of 48 positions shown; findings below may reference images not displayed]

FINDINGS: Segmentation:  5 lumbar type vertebrae

Alignment: Degenerative anterolisthesis at L4-5, grade 1.
Exaggerated lumbar lordosis with slight retrolisthesis at L1-2 and
L2-3.

Vertebrae: Minor superior endplate edema at L1 and L2, likely
degenerative. No compression deformity is seen. No aggressive bone
lesion.

Conus medullaris and cauda equina: Conus extends to the T12-L1
level. Conus and cauda equina appear normal.

Paraspinal and other soft tissues: Marked atrophy of intrinsic back
muscles, essentially completely fat replaced.

Disc levels:

T12- L1: Disc collapse and minor endplate spurring. No neural
compression

L1-L2: Disc narrowing and desiccation with mild ventral endplate
spurring

L2-L3: Disc narrowing and bulging with mild facet spurring.

L3-L4: Mild facet spurring and disc bulging.

L4-L5: Facet osteoarthritis with spurring and anterolisthesis. The
disc is narrowed and bulging. Synovial cyst at the left subarticular
recess impinging on the descending left L5 nerve root.

L5-S1:Disc narrowing and bulging with posterior ossified disc
protrusion. Bilateral facet spurring with patent foramina.
IMPRESSION: 1. Generalized lumbar spine degeneration most notable at L4-5 where
there is anterolisthesis and left anterior synovial cyst impinging
on the descending left L5 nerve root.
2. Notably severe atrophy of intrinsic back muscles.

## 2022-10-28 ENCOUNTER — Ambulatory Visit: Payer: Medicare Other | Admitting: Dermatology

## 2022-10-28 VITALS — BP 132/76

## 2022-10-28 DIAGNOSIS — L821 Other seborrheic keratosis: Secondary | ICD-10-CM | POA: Diagnosis not present

## 2022-10-28 DIAGNOSIS — D229 Melanocytic nevi, unspecified: Secondary | ICD-10-CM

## 2022-10-28 DIAGNOSIS — L814 Other melanin hyperpigmentation: Secondary | ICD-10-CM | POA: Diagnosis not present

## 2022-10-28 DIAGNOSIS — L820 Inflamed seborrheic keratosis: Secondary | ICD-10-CM | POA: Diagnosis not present

## 2022-10-28 NOTE — Patient Instructions (Addendum)
Cryotherapy Aftercare  Wash gently with soap and water everyday.   Apply Vaseline and Band-Aid daily until healed.   Melanoma ABCDEs  Melanoma is the most dangerous type of skin cancer, and is the leading cause of death from skin disease.  You are more likely to develop melanoma if you: Have light-colored skin, light-colored eyes, or red or blond hair Spend a lot of time in the sun Tan regularly, either outdoors or in a tanning bed Have had blistering sunburns, especially during childhood Have a close family member who has had a melanoma Have atypical moles or large birthmarks  Early detection of melanoma is key since treatment is typically straightforward and cure rates are extremely high if we catch it early.   The first sign of melanoma is often a change in a mole or a new dark spot.  The ABCDE system is a way of remembering the signs of melanoma.  A for asymmetry:  The two halves do not match. B for border:  The edges of the growth are irregular. C for color:  A mixture of colors are present instead of an even brown color. D for diameter:  Melanomas are usually (but not always) greater than 6mm - the size of a pencil eraser. E for evolution:  The spot keeps changing in size, shape, and color.  Please check your skin once per month between visits. You can use a small mirror in front and a large mirror behind you to keep an eye on the back side or your body.   If you see any new or changing lesions before your next follow-up, please call to schedule a visit.  Please continue daily skin protection including broad spectrum sunscreen SPF 30+ to sun-exposed areas, reapplying every 2 hours as needed when you're outdoors.     Due to recent changes in healthcare laws, you may see results of your pathology and/or laboratory studies on MyChart before the doctors have had a chance to review them. We understand that in some cases there may be results that are confusing or concerning to you.  Please understand that not all results are received at the same time and often the doctors may need to interpret multiple results in order to provide you with the best plan of care or course of treatment. Therefore, we ask that you please give us 2 business days to thoroughly review all your results before contacting the office for clarification. Should we see a critical lab result, you will be contacted sooner.   If You Need Anything After Your Visit  If you have any questions or concerns for your doctor, please call our main line at [phone] and press option 4 to reach your doctor's medical assistant. If no one answers, please leave a voicemail as directed and we will return your call as soon as possible. Messages left after 4 pm will be answered the following business day.   You may also send us a message via MyChart. We typically respond to MyChart messages within 1-2 business days.  For prescription refills, please ask your pharmacy to contact our office. Our fax number is [phone].  If you have an urgent issue when the clinic is closed that cannot wait until the next business day, you can page your doctor at the number below.    Please note that while we do our best to be available for urgent issues outside of office hours, we are not available 24/7.   If you have an urgent   issue and are unable to reach us, you may choose to seek medical care at your doctor's office, retail clinic, urgent care center, or emergency room.  If you have a medical emergency, please immediately call 911 or go to the emergency department.  Pager Numbers  - Dr. Kowalski: [phone]  - Dr. Moye: [phone]  - Dr. Stewart: [phone]  In the event of inclement weather, please call our main line at [phone] for an update on the status of any delays or closures.  Dermatology Medication Tips: Please keep the boxes that topical medications come in in order to help keep track of the  instructions about where and how to use these. Pharmacies typically print the medication instructions only on the boxes and not directly on the medication tubes.   If your medication is too expensive, please contact our office at [phone] option 4 or send us a message through MyChart.   We are unable to tell what your co-pay for medications will be in advance as this is different depending on your insurance coverage. However, we may be able to find a substitute medication at lower cost or fill out paperwork to get insurance to cover a needed medication.   If a prior authorization is required to get your medication covered by your insurance company, please allow us 1-2 business days to complete this process.  Drug prices often vary depending on where the prescription is filled and some pharmacies may offer cheaper prices.  The website www.goodrx.com contains coupons for medications through different pharmacies. The prices here do not account for what the cost may be with help from insurance (it may be cheaper with your insurance), but the website can give you the price if you did not use any insurance.  - You can print the associated coupon and take it with your prescription to the pharmacy.  - You may also stop by our office during regular business hours and pick up a GoodRx coupon card.  - If you need your prescription sent electronically to a different pharmacy, notify our office through [facility] MyChart or by phone at [phone] option 4.     Si Usted Necesita Algo Despus de Su Visita  Tambin puede enviarnos un mensaje a travs de MyChart. Por lo general respondemos a los mensajes de MyChart en el transcurso de 1 a 2 das hbiles.  Para renovar recetas, por favor pida a su farmacia que se ponga en contacto con nuestra oficina. Nuestro nmero de fax es el [phone].  Si tiene un asunto urgente cuando la clnica est cerrada y que no puede esperar hasta el siguiente da hbil,  puede llamar/localizar a su doctor(a) al nmero que aparece a continuacin.   Por favor, tenga en cuenta que aunque hacemos todo lo posible para estar disponibles para asuntos urgentes fuera del horario de oficina, no estamos disponibles las 24 horas del da, los 7 das de la semana.   Si tiene un problema urgente y no puede comunicarse con nosotros, puede optar por buscar atencin mdica  en el consultorio de su doctor(a), en una clnica privada, en un centro de atencin urgente o en una sala de emergencias.  Si tiene una emergencia mdica, por favor llame inmediatamente al 911 o vaya a la sala de emergencias.  Nmeros de bper  - Dr. Kowalski: [phone]  - Dra. Moye: [phone]  - Dra. Stewart: [phone]  En caso de inclemencias del tiempo, por favor llame a nuestra lnea principal al [phone] para una actualizacin   sobre el estado de cualquier retraso o cierre.  Consejos para la medicacin en dermatologa: Por favor, guarde las cajas en las que vienen los medicamentos de uso tpico para ayudarle a seguir las instrucciones sobre dnde y cmo usarlos. Las farmacias generalmente imprimen las instrucciones del medicamento slo en las cajas y no directamente en los tubos del medicamento.   Si su medicamento es muy caro, por favor, pngase en contacto con nuestra oficina llamando al [phone] y presione la opcin 4 o envenos un mensaje a travs de MyChart.   No podemos decirle cul ser su copago por los medicamentos por adelantado ya que esto es diferente dependiendo de la cobertura de su seguro. Sin embargo, es posible que podamos encontrar un medicamento sustituto a menor costo o llenar un formulario para que el seguro cubra el medicamento que se considera necesario.   Si se requiere una autorizacin previa para que su compaa de seguros cubra su medicamento, por favor permtanos de 1 a 2 das hbiles para completar este proceso.  Los precios de los medicamentos varan con  frecuencia dependiendo del lugar de dnde se surte la receta y alguna farmacias pueden ofrecer precios ms baratos.  El sitio web www.goodrx.com tiene cupones para medicamentos de diferentes farmacias. Los precios aqu no tienen en cuenta lo que podra costar con la ayuda del seguro (puede ser ms barato con su seguro), pero el sitio web puede darle el precio si no utiliz ningn seguro.  - Puede imprimir el cupn correspondiente y llevarlo con su receta a la farmacia.  - Tambin puede pasar por nuestra oficina durante el horario de atencin regular y recoger una tarjeta de cupones de GoodRx.  - Si necesita que su receta se enve electrnicamente a una farmacia diferente, informe a nuestra oficina a travs de MyChart de [facility] o por telfono llamando al [phone] y presione la opcin 4.  

## 2022-10-28 NOTE — Progress Notes (Signed)
   New Patient Visit  Subjective  Jill Salas is a 81 y.o. female who presents for the following: Skin Problem (Patient with some dark spots at face, some are itchy. Patient with itchy areas at chest and back. ).  Patient has seen Dr. Nicole Kindred in the past.   The following portions of the chart were reviewed this encounter and updated as appropriate:       Review of Systems:  No other skin or systemic complaints except as noted in HPI or Assessment and Plan.  Objective  Well appearing patient in no apparent distress; mood and affect are within normal limits.  A focused examination was performed including face, neck, chest and back. Relevant physical exam findings are noted in the Assessment and Plan.  R cheek x 3, plaque at R temple x 1, L mandible x 1, L preauricular x 2, L lower back x 1 (8) Erythematous stuck-on, waxy papule or plaque    Assessment & Plan  Inflamed seborrheic keratosis (8) R cheek x 3, plaque at R temple x 1, L mandible x 1, L preauricular x 2, L lower back x 1  Symptomatic, irritating, patient would like treated.   Destruction of lesion - R cheek x 3, plaque at R temple x 1, L mandible x 1, L preauricular x 2, L lower back x 1  Destruction method: cryotherapy   Informed consent: discussed and consent obtained   Lesion destroyed using liquid nitrogen: Yes   Region frozen until ice ball extended beyond lesion: Yes   Outcome: patient tolerated procedure well with no complications   Post-procedure details: wound care instructions given   Additional details:  Prior to procedure, discussed risks of blister formation, small wound, skin dyspigmentation, or rare scar following cryotherapy. Recommend Vaseline ointment to treated areas while healing.    Melanocytic Nevi - Tan-brown and/or pink-flesh-colored symmetric macules and papules - Benign appearing on exam today - Observation - Call clinic for new or changing moles - Recommend daily use of broad  spectrum spf 30+ sunscreen to sun-exposed areas.   Seborrheic Keratoses - Stuck-on, waxy, tan-brown papules and/or plaques  - Benign-appearing - Discussed benign etiology and prognosis. - Observe - Call for any changes  Lentigines - Scattered tan macules - Due to sun exposure - Benign-appearing, observe - Recommend daily broad spectrum sunscreen SPF 30+ to sun-exposed areas, reapply every 2 hours as needed. - Call for any changes  Return in about 2 months (around 12/29/2022) for ISK follow up.  Graciella Belton, RMA, am acting as scribe for Brendolyn Patty, MD .  Documentation: I have reviewed the above documentation for accuracy and completeness, and I agree with the above.  Brendolyn Patty MD

## 2023-01-12 ENCOUNTER — Ambulatory Visit: Payer: Medicare Other | Admitting: Dermatology

## 2023-01-12 ENCOUNTER — Encounter: Payer: Self-pay | Admitting: Dermatology

## 2023-01-12 VITALS — BP 134/73 | HR 70

## 2023-01-12 DIAGNOSIS — L821 Other seborrheic keratosis: Secondary | ICD-10-CM

## 2023-01-12 DIAGNOSIS — L578 Other skin changes due to chronic exposure to nonionizing radiation: Secondary | ICD-10-CM

## 2023-01-12 DIAGNOSIS — L820 Inflamed seborrheic keratosis: Secondary | ICD-10-CM | POA: Diagnosis not present

## 2023-01-12 DIAGNOSIS — L738 Other specified follicular disorders: Secondary | ICD-10-CM | POA: Diagnosis not present

## 2023-01-12 NOTE — Progress Notes (Signed)
   Follow-Up Visit   Subjective  Jill Salas is a 82 y.o. female who presents for the following: Follow-up (Patient here for 2 - 3 month isk follow up, reports a spot at right back and face that are bothering her. ).  The patient has spots, moles and lesions to be evaluated, some may be new or changing and the patient has concerns that these could be cancer.   The following portions of the chart were reviewed this encounter and updated as appropriate:      Review of Systems: No other skin or systemic complaints except as noted in HPI or Assessment and Plan.   Objective  Well appearing patient in no apparent distress; mood and affect are within normal limits.  A focused examination was performed including face, back,. Relevant physical exam findings are noted in the Assessment and Plan.  right posterior lower shoulder x 4,  right upper temple x 5, left preauricular x 2, left temple x 2 (13) Erythematous stuck-on, waxy papule or plaque   Assessment & Plan  Inflamed seborrheic keratosis (13) right posterior lower shoulder x 4,  right upper temple x 5, left preauricular x 2, left temple x 2  Symptomatic, irritating, patient would like treated.  Destruction of lesion - right posterior lower shoulder x 4,  right upper temple x 5, left preauricular x 2, left temple x 2  Destruction method: cryotherapy   Informed consent: discussed and consent obtained   Lesion destroyed using liquid nitrogen: Yes   Region frozen until ice ball extended beyond lesion: Yes   Outcome: patient tolerated procedure well with no complications   Post-procedure details: wound care instructions given   Additional details:  Prior to procedure, discussed risks of blister formation, small wound, skin dyspigmentation, or rare scar following cryotherapy. Recommend Vaseline ointment to treated areas while healing.   Sebaceous hyperplasia  Actinic skin damage  Seborrheic keratosis   Sebaceous  Hyperplasia At face / cheeks  Sample given  Adapalene 1 % otc to use nightly  - Small yellow papules with a central dell - Benign - Observe  Seborrheic Keratoses - Stuck-on, waxy, tan-brown papules and/or plaques  - Benign-appearing - Discussed benign etiology and prognosis. - Observe - Call for any changes  Actinic Damage - chronic, secondary to cumulative UV radiation exposure/sun exposure over time - diffuse scaly erythematous macules with underlying dyspigmentation - Recommend daily broad spectrum sunscreen SPF 30+ to sun-exposed areas, reapply every 2 hours as needed.  - Recommend staying in the shade or wearing long sleeves, sun glasses (UVA+UVB protection) and wide brim hats (4-inch brim around the entire circumference of the hat). - Call for new or changing lesions.  Return in 6 months (on 07/13/2023) for  isk followup.  I, Ruthell Rummage, CMA, am acting as scribe for Brendolyn Patty, MD.  Documentation: I have reviewed the above documentation for accuracy and completeness, and I agree with the above.  Brendolyn Patty MD

## 2023-01-12 NOTE — Patient Instructions (Addendum)
For bumps at face  Recommend over the counter adapalene gel - use pea sized amount to face nightly  Sample given  Can use with moisturizer if irritating      Cryotherapy Aftercare  Wash gently with soap and water everyday.   Apply Vaseline and Band-Aid daily until healed.   Seborrheic Keratosis  What causes seborrheic keratoses? Seborrheic keratoses are harmless, common skin growths that first appear during adult life.  As time goes by, more growths appear.  Some people may develop a large number of them.  Seborrheic keratoses appear on both covered and uncovered body parts.  They are not caused by sunlight.  The tendency to develop seborrheic keratoses can be inherited.  They vary in color from skin-colored to gray, brown, or even black.  They can be either smooth or have a rough, warty surface.   Seborrheic keratoses are superficial and look as if they were stuck on the skin.  Under the microscope this type of keratosis looks like layers upon layers of skin.  That is why at times the top layer may seem to fall off, but the rest of the growth remains and re-grows.    Treatment Seborrheic keratoses do not need to be treated, but can easily be removed in the office.  Seborrheic keratoses often cause symptoms when they rub on clothing or jewelry.  Lesions can be in the way of shaving.  If they become inflamed, they can cause itching, soreness, or burning.  Removal of a seborrheic keratosis can be accomplished by freezing, burning, or surgery. If any spot bleeds, scabs, or grows rapidly, please return to have it checked, as these can be an indication of a skin cancer.Due to recent changes in healthcare laws, you may see results of your pathology and/or laboratory studies on MyChart before the doctors have had a chance to review them. We understand that in some cases there may be results that are confusing or concerning to you. Please understand that not all results are received at the same time  and often the doctors may need to interpret multiple results in order to provide you with the best plan of care or course of treatment. Therefore, we ask that you please give [place] 2 business days to thoroughly review all your results before contacting the office for clarification. Should we see a critical lab result, you will be contacted sooner.   If You Need Anything After Your Visit  If you have any questions or concerns for your doctor, please call our main line at [phone] and press option 4 to reach your doctor's medical assistant. If no one answers, please leave a voicemail as directed and we will return your call as soon as possible. Messages left after 4 pm will be answered the following business day.   You may also send [place] a message via [place]. We typically respond to MyChart messages within 1-2 business days.  For prescription refills, please ask your pharmacy to contact our office. Our fax number is [phone].  If you have an urgent issue when the clinic is closed that cannot wait until the next business day, you can page your doctor at the number below.    Please note that while we do our best to be available for urgent issues outside of office hours, we are not available 24/7.   If you have an urgent issue and are unable to reach [place], you may choose to seek medical care at your doctor's office, retail clinic, urgent  care center, or emergency room.  If you have a medical emergency, please immediately call 911 or go to the emergency department.  Pager Numbers  - Dr. Nehemiah Massed: [phone]  - Dr. Laurence Ferrari: [phone]  - Dr. Nicole Kindred: [phone]  In the event of inclement weather, please call our main line at [phone] for an update on the status of any delays or closures.  Dermatology Medication Tips: Please keep the boxes that topical medications come in in order to help keep track of the instructions about where and how to use these. Pharmacies typically print the  medication instructions only on the boxes and not directly on the medication tubes.   If your medication is too expensive, please contact our office at [phone] option 4 or send [place] a message through [place].   We are unable to tell what your co-pay for medications will be in advance as this is different depending on your insurance coverage. However, we may be able to find a substitute medication at lower cost or fill out paperwork to get insurance to cover a needed medication.   If a prior authorization is required to get your medication covered by your insurance company, please allow [place] 1-2 business days to complete this process.  Drug prices often vary depending on where the prescription is filled and some pharmacies may offer cheaper prices.  The website www.goodrx.com contains coupons for medications through different pharmacies. The prices here do not account for what the cost may be with help from insurance (it may be cheaper with your insurance), but the website can give you the price if you did not use any insurance.  - You can print the associated coupon and take it with your prescription to the pharmacy.  - You may also stop by our office during regular business hours and pick up a GoodRx coupon card.  - If you need your prescription sent electronically to a different pharmacy, notify our office through [facility] or by phone at [phone] option 4.     Si Usted Necesita Algo Despus de Su Visita  Tambin puede enviarnos un mensaje a travs de Pharmacist, community. Por lo general respondemos a los mensajes de MyChart en el transcurso de 1 a 2 das hbiles.  Para renovar recetas, por favor pida a su farmacia que se ponga en contacto con nuestra oficina. Harland Dingwall de fax es [place] [phone].  Si tiene un asunto urgente cuando la clnica est cerrada y que no puede esperar hasta el siguiente da hbil, puede llamar/localizar a su doctor(a) al nmero que aparece a continuacin.    Por favor, tenga en cuenta que aunque hacemos todo lo posible para estar disponibles para asuntos urgentes fuera del horario de [place], no estamos disponibles las 24 horas del da, los 7 das de la [place].   Si tiene un problema urgente y no puede comunicarse con nosotros, puede optar por buscar atencin mdica  en el consultorio de su doctor(a), en una clnica privada, en un centro de atencin urgente o en una sala de emergencias.  Si tiene Engineering geologist, por favor llame inmediatamente al 911 o vaya a la sala de emergencias.  Nmeros de bper  - Dr. Nehemiah Massed: [phone]  - Dra. Moye: [phone]  - Dra. Nicole Kindred: [phone]  En caso de inclemencias del [place], por favor llame a Johnsie Kindred principal al [phone] para una actualizacin sobre el [place] de cualquier retraso o cierre.  Consejos para la medicacin en dermatologa: Por favor, guarde las cajas en  las que vienen los medicamentos de uso tpico para ayudarle a seguir las instrucciones sobre dnde y cmo usarlos. Las farmacias generalmente imprimen las instrucciones del medicamento slo en las cajas y no directamente en los tubos del [place].   Si su medicamento es muy caro, por favor, pngase en contacto con Zigmund Daniel llamando al [phone] y presione la opcin 4 o envenos un mensaje a travs de Pharmacist, community.   No podemos decirle cul ser su copago por los medicamentos por adelantado ya que esto es diferente dependiendo de la cobertura de su seguro. Sin embargo, es posible que podamos encontrar un medicamento sustituto a Electrical engineer un formulario para que el seguro cubra el medicamento que se considera necesario.   Si se requiere una autorizacin previa para que su compaa de seguros [place] su medicamento, por favor permtanos de 1 a 2 das hbiles para completar este proceso.  Los precios de los medicamentos varan con frecuencia dependiendo del Environmental consultant de dnde se surte la receta y alguna  farmacias pueden ofrecer precios ms baratos.  El sitio web www.goodrx.com tiene cupones para medicamentos de Airline pilot. Los precios aqu no tienen en cuenta lo que podra costar con la ayuda del seguro (puede ser ms barato con su seguro), pero el sitio web puede darle el precio si no utiliz Research scientist (physical sciences).  - Puede imprimir el cupn correspondiente y llevarlo con su receta a la farmacia.  - Tambin puede pasar por nuestra oficina durante el horario de atencin regular y Charity fundraiser una tarjeta de cupones de GoodRx.  - Si necesita que su receta se enve electrnicamente a una farmacia diferente, informe a nuestra oficina a travs de MyChart de [place] o por telfono llamando al [phone] y presione la opcin 4.

## 2023-05-18 ENCOUNTER — Other Ambulatory Visit: Payer: Self-pay | Admitting: Internal Medicine

## 2023-05-18 DIAGNOSIS — Z1231 Encounter for screening mammogram for malignant neoplasm of breast: Secondary | ICD-10-CM

## 2023-06-22 ENCOUNTER — Ambulatory Visit
Admission: RE | Admit: 2023-06-22 | Discharge: 2023-06-22 | Disposition: A | Discharge status: Home / Self Care | Payer: Medicare Other | Source: Ambulatory Visit | Attending: Internal Medicine | Admitting: Internal Medicine

## 2023-06-22 DIAGNOSIS — Z1231 Encounter for screening mammogram for malignant neoplasm of breast: Secondary | ICD-10-CM | POA: Diagnosis present

## 2023-08-03 ENCOUNTER — Ambulatory Visit: Payer: Medicare Other | Admitting: Dermatology

## 2023-08-03 VITALS — BP 135/68 | HR 64

## 2023-08-03 DIAGNOSIS — L738 Other specified follicular disorders: Secondary | ICD-10-CM

## 2023-08-03 DIAGNOSIS — L821 Other seborrheic keratosis: Secondary | ICD-10-CM | POA: Diagnosis not present

## 2023-08-03 DIAGNOSIS — L814 Other melanin hyperpigmentation: Secondary | ICD-10-CM

## 2023-08-03 DIAGNOSIS — D1801 Hemangioma of skin and subcutaneous tissue: Secondary | ICD-10-CM

## 2023-08-03 DIAGNOSIS — L820 Inflamed seborrheic keratosis: Secondary | ICD-10-CM

## 2023-08-03 DIAGNOSIS — D2339 Other benign neoplasm of skin of other parts of face: Secondary | ICD-10-CM | POA: Diagnosis not present

## 2023-08-03 DIAGNOSIS — D239 Other benign neoplasm of skin, unspecified: Secondary | ICD-10-CM

## 2023-08-03 NOTE — Progress Notes (Signed)
Follow-Up Visit   Subjective  Jill Salas is a 82 y.o. female who presents for the following: 6 month follow-up Inflamed Sks.  She has several irritated spots she would like to point out on her face and back/bra line.  The patient has spots, moles and lesions to be evaluated, some may be new or changing  The following portions of the chart were reviewed this encounter and updated as appropriate: medications, allergies, medical history  Review of Systems:  No other skin or systemic complaints except as noted in HPI or Assessment and Plan.  Objective  Well appearing patient in no apparent distress; mood and affect are within normal limits.  A focused examination was performed of the following areas: Face, trunk   Relevant physical exam findings are noted in the Assessment and Plan.  left temple hairline x 2, right frontal hairline x 2, right flank at bra line x 1, left post flank at bra line x 4 (9) Erythematous stuck-on, waxy papule or plaque    Assessment & Plan   Inflamed seborrheic keratosis (9) left temple hairline x 2, right frontal hairline x 2, right flank at bra line x 1, left post flank at bra line x 4  Symptomatic, irritating, patient would like treated.  Destruction of lesion - left temple hairline x 2, right frontal hairline x 2, right flank at bra line x 1, left post flank at bra line x 4 (9)  Destruction method: cryotherapy   Informed consent: discussed and consent obtained   Lesion destroyed using liquid nitrogen: Yes   Region frozen until ice ball extended beyond lesion: Yes   Outcome: patient tolerated procedure well with no complications   Post-procedure details: wound care instructions given   Additional details:  Prior to procedure, discussed risks of blister formation, small wound, skin dyspigmentation, or rare scar following cryotherapy. Recommend Vaseline ointment to treated areas while healing.   SEBORRHEIC KERATOSIS - Stuck-on, waxy, tan-brown  papules and/or plaques  - Benign-appearing - Discussed benign etiology and prognosis. - Observe - Call for any changes  BLUE NEVUS Exam: 1 mm blue gray macule left temple  Treatment Plan: Benign appearing on exam today. Recommend observation. Call clinic for new or changing moles. Recommend daily use of broad spectrum spf 30+ sunscreen to sun-exposed areas.   Sebaceous Hyperplasia - Small yellow papules with a central dell - Benign-appearing - Observe. Call for changes. - sample of adapalene 0.1% gel given to apply nightly. Topical retinoid medications like tretinoin/Retin-A, adapalene/Differin, tazarotene/Fabior, and Epiduo/Epiduo Forte can cause dryness and irritation when first started. Only apply a pea-sized amount to the entire affected area. Avoid applying it around the eyes, edges of mouth and creases at the nose. If you experience irritation, use a good moisturizer first and/or apply the medicine less often. If you are doing well with the medicine, you can increase how often you use it until you are applying every night. Be careful with sun protection while using this medication as it can make you sensitive to the sun. This medicine should not be used by pregnant women.   LENTIGINES Exam: scattered tan macules Due to sun exposure Treatment Plan: Benign-appearing, observe. Recommend daily broad spectrum sunscreen SPF 30+ to sun-exposed areas, reapply every 2 hours as needed.  Call for any changes  HEMANGIOMA Exam: red papule(s) Discussed benign nature. Recommend observation. Call for changes.   Return in about 6 months (around 01/31/2024) for ISKs.  Wendee Beavers, CMA, am acting as scribe  for Willeen Niece, MD .   Documentation: I have reviewed the above documentation for accuracy and completeness, and I agree with the above.  Willeen Niece, MD

## 2023-08-03 NOTE — Patient Instructions (Signed)
Cryotherapy Aftercare  Wash gently with soap and water everyday.   Apply Vaseline and Band-Aid daily until healed.   Seborrheic Keratosis  What causes seborrheic keratoses? Seborrheic keratoses are harmless, common skin growths that first appear during adult life.  As time goes by, more growths appear.  Some people may develop a large number of them.  Seborrheic keratoses appear on both covered and uncovered body parts.  They are not caused by sunlight.  The tendency to develop seborrheic keratoses can be inherited.  They vary in color from skin-colored to gray, brown, or even black.  They can be either smooth or have a rough, warty surface.   Seborrheic keratoses are superficial and look as if they were stuck on the skin.  Under the microscope this type of keratosis looks like layers upon layers of skin.  That is why at times the top layer may seem to fall off, but the rest of the growth remains and re-grows.    Treatment Seborrheic keratoses do not need to be treated, but can easily be removed in the office.  Seborrheic keratoses often cause symptoms when they rub on clothing or jewelry.  Lesions can be in the way of shaving.  If they become inflamed, they can cause itching, soreness, or burning.  Removal of a seborrheic keratosis can be accomplished by freezing, burning, or surgery. If any spot bleeds, scabs, or grows rapidly, please return to have it checked, as these can be an indication of a skin cancer.   Due to recent changes in healthcare laws, you may see results of your pathology and/or laboratory studies on MyChart before the doctors have had a chance to review them. We understand that in some cases there may be results that are confusing or concerning to you. Please understand that not all results are received at the same time and often the doctors may need to interpret multiple results in order to provide you with the best plan of care or course of treatment. Therefore, we ask that you  please give [place] 2 business days to thoroughly review all your results before contacting the office for clarification. Should we see a critical lab result, you will be contacted sooner.   If You Need Anything After Your Visit  If you have any questions or concerns for your doctor, please call our main line at [phone] and press option 4 to reach your doctor's medical assistant. If no one answers, please leave a voicemail as directed and we will return your call as soon as possible. Messages left after 4 pm will be answered the following business day.   You may also send [place] a message via MyChart. We typically respond to MyChart messages within 1-2 business days.  For prescription refills, please ask your pharmacy to contact our office. Our fax number is [phone].  If you have an urgent issue when the clinic is closed that cannot wait until the next business day, you can page your doctor at the number below.    Please note that while we do our best to be available for urgent issues outside of office hours, we are not available 24/7.   If you have an urgent issue and are unable to reach [place], you may choose to seek medical care at your doctor's office, retail clinic, urgent care center, or emergency room.  If you have a medical emergency, please immediately call 911 or go to the emergency department.  Pager Numbers  - Dr. Gwen Pounds: [phone]  -  Dr. Roseanne Reno: [phone]  - Dr. Katrinka Blazing: [phone]   In the event of inclement weather, please call our main line at [phone] for an update on the status of any delays or closures.  Dermatology Medication Tips: Please keep the boxes that topical medications come in in order to help keep track of the instructions about where and how to use these. Pharmacies typically print the medication instructions only on the boxes and not directly on the medication tubes.   If your medication is too expensive, please contact our office at  [phone] option 4 or send [place] a message through MyChart.   We are unable to tell what your co-pay for medications will be in advance as this is different depending on your insurance coverage. However, we may be able to find a substitute medication at lower cost or fill out paperwork to get insurance to cover a needed medication.   If a prior authorization is required to get your medication covered by your insurance company, please allow [place] 1-2 business days to complete this process.  Drug prices often vary depending on where the prescription is filled and some pharmacies may offer cheaper prices.  The website www.goodrx.com contains coupons for medications through different pharmacies. The prices here do not account for what the cost may be with help from insurance (it may be cheaper with your insurance), but the website can give you the price if you did not use any insurance.  - You can print the associated coupon and take it with your prescription to the pharmacy.  - You may also stop by our office during regular business hours and pick up a GoodRx coupon card.  - If you need your prescription sent electronically to a different pharmacy, notify our office through [facility] or by phone at [phone] option 4.     Si Usted Necesita Algo Despus de Su Visita  Tambin puede enviarnos un mensaje a travs de Clinical cytogeneticist. Por lo general respondemos a los mensajes de MyChart en el transcurso de 1 a 2 das hbiles.  Para renovar recetas, por favor pida a su farmacia que se ponga en contacto con nuestra oficina. Annie Sable de fax es [place] [phone].  Si tiene un asunto urgente cuando la clnica est cerrada y que no puede esperar hasta el siguiente da hbil, puede llamar/localizar a su doctor(a) al nmero que aparece a continuacin.   Por favor, tenga en cuenta que aunque hacemos todo lo posible para estar disponibles para asuntos urgentes fuera del horario de [place], no estamos  disponibles las 24 horas del da, los 7 [address]  [address] de la [place].   Si tiene un problema urgente y no puede comunicarse con nosotros, puede optar por buscar atencin mdica  en el consultorio de su doctor(a), en una clnica privada, en un centro de atencin urgente o en una sala de emergencias.  Si tiene Engineer, drilling, por favor llame inmediatamente al 911 o vaya a la sala de emergencias.  Nmeros de bper  - Dr. Gwen Pounds: [phone]  - Dra. Roseanne Reno: [phone]  - Dr. Katrinka Blazing: [phone]   En caso de inclemencias del tiempo, por favor llame a Lacy Duverney principal al [phone] para una actualizacin sobre el [place] de cualquier retraso o cierre.  Consejos para la medicacin en dermatologa: Por favor, guarde las cajas en las que vienen los medicamentos de uso tpico para ayudarle a seguir las instrucciones sobre dnde y cmo usarlos. Las farmacias generalmente imprimen las instrucciones del medicamento slo en las  cajas y no directamente en los tubos del medicamento.   Si su medicamento es muy caro, por favor, pngase en contacto con Rolm Gala llamando al [phone] y presione la opcin 4 o envenos un mensaje a travs de Clinical cytogeneticist.   No podemos decirle cul ser su copago por los medicamentos por adelantado ya que esto es diferente dependiendo de la cobertura de su seguro. Sin embargo, es posible que podamos encontrar un medicamento sustituto a Audiological scientist un formulario para que el seguro cubra el medicamento que se considera necesario.   Si se requiere una autorizacin previa para que su compaa de seguros [place] su medicamento, por favor permtanos de 1 a 2 das hbiles para completar [address]  Los precios de los medicamentos varan con frecuencia dependiendo del Environmental consultant de dnde se surte la receta y alguna farmacias pueden ofrecer precios ms baratos.  El sitio web www.goodrx.com tiene cupones para medicamentos de Health and safety inspector. Los precios aqu no  tienen en cuenta lo que podra costar con la ayuda del seguro (puede ser ms barato con su seguro), pero el sitio web puede darle el precio si no utiliz Tourist information centre manager.  - Puede imprimir el cupn correspondiente y llevarlo con su receta a la farmacia.  - Tambin puede pasar por nuestra oficina durante el horario de atencin regular y Education officer, museum una tarjeta de cupones de GoodRx.  - Si necesita que su receta se enve electrnicamente a una farmacia diferente, informe a nuestra oficina a travs de MyChart de [place] o por telfono llamando al [phone] y presione la opcin 4.

## 2024-02-01 ENCOUNTER — Ambulatory Visit: Payer: Medicare Other | Admitting: Dermatology

## 2024-02-01 ENCOUNTER — Encounter: Payer: Self-pay | Admitting: Dermatology

## 2024-02-01 DIAGNOSIS — L821 Other seborrheic keratosis: Secondary | ICD-10-CM | POA: Diagnosis not present

## 2024-02-01 DIAGNOSIS — W908XXA Exposure to other nonionizing radiation, initial encounter: Secondary | ICD-10-CM

## 2024-02-01 DIAGNOSIS — L820 Inflamed seborrheic keratosis: Secondary | ICD-10-CM | POA: Diagnosis not present

## 2024-02-01 DIAGNOSIS — L578 Other skin changes due to chronic exposure to nonionizing radiation: Secondary | ICD-10-CM | POA: Diagnosis not present

## 2024-02-01 NOTE — Progress Notes (Signed)
   Follow-Up Visit   Subjective  Jill Salas is a 83 y.o. female who presents for the following: 6 month ISK follow up. Back. Has more lesions to be treated. Rubbed, irritated, itchy.   The patient has spots, moles and lesions to be evaluated, some may be new or changing and the patient may have concern these could be cancer.    The following portions of the chart were reviewed this encounter and updated as appropriate: medications, allergies, medical history  Review of Systems:  No other skin or systemic complaints except as noted in HPI or Assessment and Plan.  Objective  Well appearing patient in no apparent distress; mood and affect are within normal limits.  A focused examination was performed of the following areas: Back   Relevant physical exam findings are noted in the Assessment and Plan.  L mid back x 10, mid back x 12 (22) Stuck on waxy paps with erythema  Assessment & Plan   SEBORRHEIC KERATOSIS - Stuck-on, waxy, tan-brown papules and/or plaques  - Benign-appearing - Discussed benign etiology and prognosis. - Observe - Call for any changes  ACTINIC DAMAGE - chronic, secondary to cumulative UV radiation exposure/sun exposure over time - diffuse scaly erythematous macules with underlying dyspigmentation - Recommend daily broad spectrum sunscreen SPF 30+ to sun-exposed areas, reapply every 2 hours as needed.  - Recommend staying in the shade or wearing long sleeves, sun glasses (UVA+UVB protection) and wide brim hats (4-inch brim around the entire circumference of the hat). - Call for new or changing lesions.    INFLAMED SEBORRHEIC KERATOSIS (22) L mid back x 10, mid back x 12 (22) Symptomatic, irritating, patient would like treated. Destruction of lesion - L mid back x 10, mid back x 12 (22)  Destruction method: cryotherapy   Informed consent: discussed and consent obtained   Lesion destroyed using liquid nitrogen: Yes   Region frozen until ice ball  extended beyond lesion: Yes   Outcome: patient tolerated procedure well with no complications   Post-procedure details: wound care instructions given   Additional details:  Prior to procedure, discussed risks of blister formation, small wound, skin dyspigmentation, or rare scar following cryotherapy. Recommend Vaseline ointment to treated areas while healing.    Return in about 6 months (around 08/03/2024) for ISK f/u.  I, Lawson Radar, CMA, am acting as scribe for Willeen Niece, MD.  I, Ardis Rowan, RMA, am acting as scribe for Willeen Niece, MD .   Documentation: I have reviewed the above documentation for accuracy and completeness, and I agree with the above.  Willeen Niece, MD

## 2024-02-01 NOTE — Patient Instructions (Signed)
 Cryotherapy Aftercare  Wash gently with soap and water everyday.   Apply Vaseline Jelly daily until healed.     Recommend daily broad spectrum sunscreen SPF 30+ to sun-exposed areas, reapply every 2 hours as needed. Call for new or changing lesions.  Staying in the shade or wearing long sleeves, sun glasses (UVA+UVB protection) and wide brim hats (4-inch brim around the entire circumference of the hat) are also recommended for sun protection.      Due to recent changes in healthcare laws, you may see results of your pathology and/or laboratory studies on MyChart before the doctors have had a chance to review them. We understand that in some cases there may be results that are confusing or concerning to you. Please understand that not all results are received at the same time and often the doctors may need to interpret multiple results in order to provide you with the best plan of care or course of treatment. Therefore, we ask that you please give [place] 2 business days to thoroughly review all your results before contacting the office for clarification. Should we see a critical lab result, you will be contacted sooner.   If You Need Anything After Your Visit  If you have any questions or concerns for your doctor, please call our main line at [phone] and press option 4 to reach your doctor's medical assistant. If no one answers, please leave a voicemail as directed and we will return your call as soon as possible. Messages left after 4 pm will be answered the following business day.   You may also send [place] a message via MyChart. We typically respond to MyChart messages within 1-2 business days.  For prescription refills, please ask your pharmacy to contact our office. Our fax number is [phone].  If you have an urgent issue when the clinic is closed that cannot wait until the next business day, you can page your doctor at the number below.    Please note that while we do our best to be  available for urgent issues outside of office hours, we are not available 24/7.   If you have an urgent issue and are unable to reach [place], you may choose to seek medical care at your doctor's office, retail clinic, urgent care center, or emergency room.  If you have a medical emergency, please immediately call 911 or go to the emergency department.  Pager Numbers  - Dr. Gwen Pounds: [phone]  - Dr. Roseanne Reno: [phone]  - Dr. Katrinka Blazing: [phone]   In the event of inclement weather, please call our main line at [phone] for an update on the status of any delays or closures.  Dermatology Medication Tips: Please keep the boxes that topical medications come in in order to help keep track of the instructions about where and how to use these. Pharmacies typically print the medication instructions only on the boxes and not directly on the medication tubes.   If your medication is too expensive, please contact our office at [phone] option 4 or send [place] a message through MyChart.   We are unable to tell what your co-pay for medications will be in advance as this is different depending on your insurance coverage. However, we may be able to find a substitute medication at lower cost or fill out paperwork to get insurance to cover a needed medication.   If a prior authorization is required to get your medication covered by your insurance company, please allow [place] 1-2 business days to complete  this process.  Drug prices often vary depending on where the prescription is filled and some pharmacies may offer cheaper prices.  The website www.goodrx.com contains coupons for medications through different pharmacies. The prices here do not account for what the cost may be with help from insurance (it may be cheaper with your insurance), but the website can give you the price if you did not use any insurance.  - You can print the associated coupon and take it with your prescription to the pharmacy.   - You may also stop by our office during regular business hours and pick up a GoodRx coupon card.  - If you need your prescription sent electronically to a different pharmacy, notify our office through [facility] or by phone at [phone] option 4.     Si Usted Necesita Algo Despus de Su Visita  Tambin puede enviarnos un mensaje a travs de Clinical cytogeneticist. Por lo general respondemos a los mensajes de MyChart en el transcurso de 1 a 2 das hbiles.  Para renovar recetas, por favor pida a su farmacia que se ponga en contacto con nuestra oficina. Annie Sable de fax es [place] [phone].  Si tiene un asunto urgente cuando la clnica est cerrada y que no puede esperar hasta el siguiente da hbil, puede llamar/localizar a su doctor(a) al nmero que aparece a continuacin.   Por favor, tenga en cuenta que aunque hacemos todo lo posible para estar disponibles para asuntos urgentes fuera del horario de [place], no estamos disponibles las 24 horas del da, los 7 [address]  [address] de la [place].   Si tiene un problema urgente y no puede comunicarse con nosotros, puede optar por buscar atencin mdica  en el consultorio de su doctor(a), en una clnica privada, en un centro de atencin urgente o en una sala de emergencias.  Si tiene Engineer, drilling, por favor llame inmediatamente al 911 o vaya a la sala de emergencias.  Nmeros de bper  - Dr. Gwen Pounds: [phone]  - Dra. Roseanne Reno: [phone]  - Dr. Katrinka Blazing: [phone]   En caso de inclemencias del tiempo, por favor llame a Lacy Duverney principal al [phone] para una actualizacin sobre el [place] de cualquier retraso o cierre.  Consejos para la medicacin en dermatologa: Por favor, guarde las cajas en las que vienen los medicamentos de uso tpico para ayudarle a seguir las instrucciones sobre dnde y cmo usarlos. Las farmacias generalmente imprimen las instrucciones del medicamento slo en las cajas y no directamente en los tubos del  [place].   Si su medicamento es muy caro, por favor, pngase en contacto con Rolm Gala llamando al [phone] y presione la opcin 4 o envenos un mensaje a travs de Clinical cytogeneticist.   No podemos decirle cul ser su copago por los medicamentos por adelantado ya que esto es diferente dependiendo de la cobertura de su seguro. Sin embargo, es posible que podamos encontrar un medicamento sustituto a Audiological scientist un formulario para que el seguro cubra el medicamento que se considera necesario.   Si se requiere una autorizacin previa para que su compaa de seguros [place] su medicamento, por favor permtanos de 1 a 2 das hbiles para completar [address]  Los precios de los medicamentos varan con frecuencia dependiendo del Environmental consultant de dnde se surte la receta y alguna farmacias pueden ofrecer precios ms baratos.  El sitio web www.goodrx.com tiene cupones para medicamentos de Health and safety inspector. Los precios aqu no tienen en cuenta lo que podra costar con la ayuda del  seguro (puede ser ms barato con su seguro), pero el sitio web puede darle el precio si no Visual merchandiser.  - Puede imprimir el cupn correspondiente y llevarlo con su receta a la farmacia.  - Tambin puede pasar por nuestra oficina durante el horario de atencin regular y Education officer, museum una tarjeta de cupones de GoodRx.  - Si necesita que su receta se enve electrnicamente a una farmacia diferente, informe a nuestra oficina a travs de MyChart de [place] o por telfono llamando al [phone] y presione la opcin 4.

## 2024-05-24 ENCOUNTER — Other Ambulatory Visit: Payer: Self-pay | Admitting: Internal Medicine

## 2024-05-24 DIAGNOSIS — Z1231 Encounter for screening mammogram for malignant neoplasm of breast: Secondary | ICD-10-CM

## 2024-06-22 ENCOUNTER — Ambulatory Visit
Admission: RE | Admit: 2024-06-22 | Discharge: 2024-06-22 | Disposition: A | Discharge status: Home / Self Care | Source: Ambulatory Visit | Attending: Internal Medicine | Admitting: Internal Medicine

## 2024-06-22 DIAGNOSIS — Z1231 Encounter for screening mammogram for malignant neoplasm of breast: Secondary | ICD-10-CM | POA: Diagnosis present

## 2024-08-15 ENCOUNTER — Ambulatory Visit: Admitting: Dermatology

## 2024-08-15 DIAGNOSIS — L820 Inflamed seborrheic keratosis: Secondary | ICD-10-CM

## 2024-08-15 DIAGNOSIS — W908XXA Exposure to other nonionizing radiation, initial encounter: Secondary | ICD-10-CM | POA: Diagnosis not present

## 2024-08-15 DIAGNOSIS — L578 Other skin changes due to chronic exposure to nonionizing radiation: Secondary | ICD-10-CM | POA: Diagnosis not present

## 2024-08-15 DIAGNOSIS — L821 Other seborrheic keratosis: Secondary | ICD-10-CM

## 2024-08-15 NOTE — Patient Instructions (Addendum)

## 2024-08-15 NOTE — Progress Notes (Signed)
   Follow-Up Visit   Subjective  Jill Salas is a 83 y.o. female who presents for the following: 6 month ISK f/u, patient reports itchy places at back and R inframammary.   The following portions of the chart were reviewed this encounter and updated as appropriate: medications, allergies, medical history  Review of Systems:  No other skin or systemic complaints except as noted in HPI or Assessment and Plan.  Objective  Well appearing patient in no apparent distress; mood and affect are within normal limits.  A focused examination was performed of the following areas: Back, inframammary   Relevant exam findings are noted in the Assessment and Plan.  R mid back x 8, L lower back x1, R flank x8 (17) Stuck on waxy papules and plaques with erythema and crusting  Assessment & Plan   ACTINIC DAMAGE - chronic, secondary to cumulative UV radiation exposure/sun exposure over time - diffuse scaly erythematous macules with underlying dyspigmentation - Recommend daily broad spectrum sunscreen SPF 30+ to sun-exposed areas, reapply every 2 hours as needed.  - Recommend staying in the shade or wearing long sleeves, sun glasses (UVA+UVB protection) and wide brim hats (4-inch brim around the entire circumference of the hat). - Call for new or changing lesions.  SEBORRHEIC KERATOSIS - Stuck-on, waxy, tan-brown papules and/or plaques  - Benign-appearing - Discussed benign etiology and prognosis. - Observe - Call for any changes  INFLAMED SEBORRHEIC KERATOSIS (17) R mid back x 8, L lower back x1, R flank x8 (17) Symptomatic, irritating, patient would like treated. Destruction of lesion - R mid back x 8, L lower back x1, R flank x8 (17)  Destruction method: cryotherapy   Informed consent: discussed and consent obtained   Lesion destroyed using liquid nitrogen: Yes   Region frozen until ice ball extended beyond lesion: Yes   Outcome: patient tolerated procedure well with no complications    Post-procedure details: wound care instructions given   Additional details:  Prior to procedure, discussed risks of blister formation, small wound, skin dyspigmentation, or rare scar following cryotherapy. Recommend Vaseline ointment to treated areas while healing.    Return in about 6 months (around 02/12/2025) for ISK follow-up, w/ Dr. Jackquline.  I, Jacquelynn V. Wilfred, CMA, am acting as scribe for Rexene Jackquline, MD .   Documentation: I have reviewed the above documentation for accuracy and completeness, and I agree with the above.  Rexene Jackquline, MD

## 2025-02-27 ENCOUNTER — Ambulatory Visit: Admitting: Dermatology
